# Patient Record
Sex: Female | Born: 1975 | Race: Black or African American | Hispanic: No | Marital: Married | Smoking: Never smoker
Health system: Southern US, Community
[De-identification: ages and names within clinical notes are randomized; demographics above are authoritative.]

## PROBLEM LIST (undated history)

## (undated) DIAGNOSIS — G4730 Sleep apnea, unspecified: Secondary | ICD-10-CM

## (undated) DIAGNOSIS — I10 Essential (primary) hypertension: Secondary | ICD-10-CM

## (undated) DIAGNOSIS — Z8489 Family history of other specified conditions: Secondary | ICD-10-CM

## (undated) DIAGNOSIS — M545 Low back pain, unspecified: Secondary | ICD-10-CM

## (undated) DIAGNOSIS — D259 Leiomyoma of uterus, unspecified: Secondary | ICD-10-CM

## (undated) DIAGNOSIS — Z973 Presence of spectacles and contact lenses: Secondary | ICD-10-CM

---

## 1994-12-18 HISTORY — PX: DIAGNOSTIC LAPAROSCOPY: SUR761

## 1998-12-18 HISTORY — PX: TUBAL LIGATION: SHX77

## 2014-03-13 ENCOUNTER — Ambulatory Visit (HOSPITAL_BASED_OUTPATIENT_CLINIC_OR_DEPARTMENT_OTHER): Payer: BC Managed Care – PPO

## 2014-04-24 ENCOUNTER — Ambulatory Visit (HOSPITAL_BASED_OUTPATIENT_CLINIC_OR_DEPARTMENT_OTHER): Payer: BC Managed Care – PPO | Attending: Family Medicine

## 2015-03-01 ENCOUNTER — Other Ambulatory Visit: Payer: Self-pay | Admitting: Family Medicine

## 2015-03-01 DIAGNOSIS — Z1231 Encounter for screening mammogram for malignant neoplasm of breast: Secondary | ICD-10-CM

## 2015-03-12 ENCOUNTER — Ambulatory Visit
Admission: RE | Admit: 2015-03-12 | Discharge: 2015-03-12 | Disposition: A | Discharge status: Home / Self Care | Payer: BLUE CROSS/BLUE SHIELD | Source: Ambulatory Visit | Attending: Family Medicine | Admitting: Family Medicine

## 2015-03-12 ENCOUNTER — Other Ambulatory Visit: Payer: Self-pay | Admitting: Family Medicine

## 2015-03-12 DIAGNOSIS — Z1231 Encounter for screening mammogram for malignant neoplasm of breast: Secondary | ICD-10-CM

## 2017-12-25 ENCOUNTER — Other Ambulatory Visit: Payer: Self-pay | Admitting: Family Medicine

## 2017-12-25 DIAGNOSIS — Z1231 Encounter for screening mammogram for malignant neoplasm of breast: Secondary | ICD-10-CM

## 2018-01-14 ENCOUNTER — Ambulatory Visit
Admission: RE | Admit: 2018-01-14 | Discharge: 2018-01-14 | Disposition: A | Discharge status: Home / Self Care | Payer: BLUE CROSS/BLUE SHIELD | Source: Ambulatory Visit | Attending: Family Medicine | Admitting: Family Medicine

## 2018-01-14 DIAGNOSIS — Z1231 Encounter for screening mammogram for malignant neoplasm of breast: Secondary | ICD-10-CM

## 2019-07-12 ENCOUNTER — Encounter (HOSPITAL_COMMUNITY): Payer: Self-pay

## 2019-07-12 ENCOUNTER — Emergency Department (HOSPITAL_COMMUNITY)
Admission: EM | Admit: 2019-07-12 | Discharge: 2019-07-13 | Disposition: A | Discharge status: Home / Self Care | Payer: BC Managed Care – PPO | Attending: Emergency Medicine | Admitting: Emergency Medicine

## 2019-07-12 ENCOUNTER — Other Ambulatory Visit: Payer: Self-pay

## 2019-07-12 DIAGNOSIS — Z5321 Procedure and treatment not carried out due to patient leaving prior to being seen by health care provider: Secondary | ICD-10-CM | POA: Insufficient documentation

## 2019-07-12 DIAGNOSIS — M79641 Pain in right hand: Secondary | ICD-10-CM | POA: Insufficient documentation

## 2019-07-12 NOTE — ED Triage Notes (Signed)
Pt arrives POV for eval of dog bite to R hand from dog that she owns. Pt reports dog is up to date on vaccines. Minor cut to R hand hemostatic in triage

## 2019-07-13 ENCOUNTER — Emergency Department (HOSPITAL_COMMUNITY)
Admission: EM | Admit: 2019-07-13 | Discharge: 2019-07-13 | Disposition: A | Discharge status: Home / Self Care | Payer: Self-pay | Attending: Emergency Medicine | Admitting: Emergency Medicine

## 2019-07-13 DIAGNOSIS — S61256A Open bite of right little finger without damage to nail, initial encounter: Secondary | ICD-10-CM | POA: Insufficient documentation

## 2019-07-13 DIAGNOSIS — W540XXA Bitten by dog, initial encounter: Secondary | ICD-10-CM | POA: Insufficient documentation

## 2019-07-13 DIAGNOSIS — Y929 Unspecified place or not applicable: Secondary | ICD-10-CM | POA: Insufficient documentation

## 2019-07-13 DIAGNOSIS — Y999 Unspecified external cause status: Secondary | ICD-10-CM | POA: Insufficient documentation

## 2019-07-13 DIAGNOSIS — Y93K9 Activity, other involving animal care: Secondary | ICD-10-CM | POA: Insufficient documentation

## 2019-07-13 MED ORDER — AMOXICILLIN-POT CLAVULANATE 875-125 MG PO TABS
1.0000 | ORAL_TABLET | Freq: Once | ORAL | Status: AC
  Administered 2019-07-13: 1 via ORAL
  Filled 2019-07-13: qty 1

## 2019-07-13 MED ORDER — IBUPROFEN 800 MG PO TABS
800.0000 mg | ORAL_TABLET | Freq: Once | ORAL | Status: AC
  Administered 2019-07-13: 800 mg via ORAL
  Filled 2019-07-13: qty 1

## 2019-07-13 MED ORDER — AMOXICILLIN-POT CLAVULANATE 875-125 MG PO TABS: 1.0000 | ORAL_TABLET | Freq: Two times a day (BID) | ORAL | 0 refills | Status: DC

## 2019-07-13 NOTE — ED Notes (Signed)
Pt. Came to desk about 20 minutes ago asking how long. Time estimate was given and patient went to have a seat. This tech just received a call from [employer] stating pt. Had checked in there. Pt. Needs to be discharged from our system.

## 2019-07-13 NOTE — Discharge Instructions (Addendum)
Keep finger clean with soap and warm water. Can take tylenol or motrin for pain. Take augmentin as directed, recommend taking with food. Follow-up with your primary care doctor. Return here for any new/acute changes.

## 2019-07-13 NOTE — ED Triage Notes (Signed)
Pt here for evaluation of a dog bite to her right pinky finger

## 2019-07-13 NOTE — ED Provider Notes (Signed)
[facility] DEPT Provider Note   CSN: [id number] Arrival date & time: 07/13/19  0105     History   Chief Complaint No chief complaint on file.   HPI Deanna Mack is a 43 y.o. female.  43 y.o. F here with dog bite to right 5th digit from her dog (mix german Shepherd/lab).  States dog is generally not aggressive but it "snipped" at her.  Has 1.5cm laceration/skin tear to right 5th digit.  No active bleeding.  Tetanus UTD.  Dog is UTD on vaccinations.  The history is provided by the patient and medical records.    No past medical history on file.  There are no active problems to display for this patient.   No past surgical history on file.   OB History   No obstetric history on file.      Home Medications    Prior to Admission medications   Not on File    Family History No family history on file.  Social History Social History   Tobacco Use  . Smoking status: Never Smoker  . Smokeless tobacco: Never Used  Substance Use Topics  . Alcohol use: Not Currently  . Drug use: Not Currently     Allergies   Patient has no known allergies.   Review of Systems Review of Systems  Skin: Positive for wound.  All other systems reviewed and are negative.    Physical Exam Updated Vital Signs BP (!) 183/92 (BP Location: Left Arm)   Temp 99 F (37.2 C) (Oral)   Resp 16   Ht 5\' 8"  (1.727 m)   Wt 99.8 kg   SpO2 98%   BMI 33.45 kg/m   Physical Exam Vitals signs and nursing note reviewed.  Constitutional:      Appearance: She is well-developed.  HENT:     Head: Normocephalic and atraumatic.  Eyes:     Conjunctiva/sclera: Conjunctivae normal.     Pupils: Pupils are equal, round, and reactive to light.  Neck:     Musculoskeletal: Normal range of motion.  Cardiovascular:     Rate and Rhythm: Normal rate and regular rhythm.     Heart sounds: Normal heart sounds.  Pulmonary:     Effort: Pulmonary effort is normal.      Breath sounds: Normal breath sounds.  Abdominal:     General: Bowel sounds are normal.     Palpations: Abdomen is soft.  Musculoskeletal: Normal range of motion.     Comments: Right 5th digit with 1.5cm superficial laceration (resembles more of a skin tear); no active bleeding, full ROM of finger, no deep tissue, vessel, or tendon involvement  Skin:    General: Skin is warm and dry.  Neurological:     Mental Status: She is alert and oriented to person, place, and time.      ED Treatments / Results  Labs (all labs ordered are listed, but only abnormal results are displayed) Labs Reviewed - No data to display  EKG None  Radiology No results found.  Procedures Procedures (including critical care time)  Medications Ordered in ED Medications  amoxicillin-clavulanate (AUGMENTIN) 875-125 MG per tablet 1 tablet (has no administration in time range)  ibuprofen (ADVIL) tablet 800 mg (has no administration in time range)     Initial Impression / Assessment and Plan / ED Course  I have reviewed the triage vital signs and the nursing notes.  Pertinent labs & imaging results that were available during my care  of the patient were reviewed by me and considered in my medical decision making (see chart for details).  43 y.o. F here with superficial laceration (more of a skin tear) to right 5th digit from her dog.  Her tetanus is up-to-date and dog is up-to-date on vaccines.  There is no active bleeding of the wound and does not require repair.  Will start on course of Augmentin, continue home wound care.  Close follow-up with PCP.  Return here for any new or acute changes.  Final Clinical Impressions(s) / ED Diagnoses   Final diagnoses:  Dog bite, initial encounter    ED Discharge Orders         Ordered    amoxicillin-clavulanate (AUGMENTIN) 875-125 MG tablet  Every 12 hours     07/13/19 0130           Larene Pickett, PA-C 07/13/19 Sobieski, [place], DO 07/13/19  8675

## 2019-09-18 DIAGNOSIS — U071 COVID-19: Secondary | ICD-10-CM

## 2019-09-18 HISTORY — DX: COVID-19: U07.1

## 2019-12-17 DIAGNOSIS — R05 Cough: Secondary | ICD-10-CM | POA: Diagnosis not present

## 2019-12-17 DIAGNOSIS — R0981 Nasal congestion: Secondary | ICD-10-CM | POA: Diagnosis not present

## 2019-12-17 DIAGNOSIS — Z0000 Encounter for general adult medical examination without abnormal findings: Secondary | ICD-10-CM | POA: Diagnosis not present

## 2019-12-29 ENCOUNTER — Other Ambulatory Visit: Payer: Self-pay | Admitting: Internal Medicine

## 2019-12-29 DIAGNOSIS — Z0000 Encounter for general adult medical examination without abnormal findings: Secondary | ICD-10-CM | POA: Diagnosis not present

## 2019-12-29 DIAGNOSIS — Z13220 Encounter for screening for lipoid disorders: Secondary | ICD-10-CM | POA: Diagnosis not present

## 2019-12-29 DIAGNOSIS — Z1231 Encounter for screening mammogram for malignant neoplasm of breast: Secondary | ICD-10-CM

## 2019-12-29 DIAGNOSIS — D649 Anemia, unspecified: Secondary | ICD-10-CM | POA: Diagnosis not present

## 2019-12-29 DIAGNOSIS — Z23 Encounter for immunization: Secondary | ICD-10-CM | POA: Diagnosis not present

## 2019-12-31 ENCOUNTER — Other Ambulatory Visit: Payer: Self-pay

## 2019-12-31 ENCOUNTER — Ambulatory Visit
Admission: RE | Admit: 2019-12-31 | Discharge: 2019-12-31 | Disposition: A | Discharge status: Home / Self Care | Payer: BC Managed Care – PPO | Source: Ambulatory Visit | Attending: Internal Medicine | Admitting: Internal Medicine

## 2019-12-31 DIAGNOSIS — Z1231 Encounter for screening mammogram for malignant neoplasm of breast: Secondary | ICD-10-CM | POA: Diagnosis not present

## 2020-10-19 DIAGNOSIS — G479 Sleep disorder, unspecified: Secondary | ICD-10-CM | POA: Diagnosis not present

## 2020-10-29 ENCOUNTER — Other Ambulatory Visit (HOSPITAL_BASED_OUTPATIENT_CLINIC_OR_DEPARTMENT_OTHER): Payer: Self-pay

## 2020-10-29 DIAGNOSIS — R0683 Snoring: Secondary | ICD-10-CM

## 2020-10-29 DIAGNOSIS — G4710 Hypersomnia, unspecified: Secondary | ICD-10-CM

## 2020-10-29 DIAGNOSIS — R0681 Apnea, not elsewhere classified: Secondary | ICD-10-CM

## 2020-11-22 ENCOUNTER — Ambulatory Visit (HOSPITAL_BASED_OUTPATIENT_CLINIC_OR_DEPARTMENT_OTHER): Payer: 59 | Attending: Internal Medicine | Admitting: Internal Medicine

## 2020-11-22 ENCOUNTER — Other Ambulatory Visit: Payer: Self-pay

## 2020-11-22 DIAGNOSIS — G4710 Hypersomnia, unspecified: Secondary | ICD-10-CM | POA: Diagnosis not present

## 2020-11-22 DIAGNOSIS — R0681 Apnea, not elsewhere classified: Secondary | ICD-10-CM | POA: Diagnosis not present

## 2020-11-22 DIAGNOSIS — G4733 Obstructive sleep apnea (adult) (pediatric): Secondary | ICD-10-CM

## 2020-11-22 DIAGNOSIS — R0683 Snoring: Secondary | ICD-10-CM | POA: Diagnosis not present

## 2020-12-06 DIAGNOSIS — G4733 Obstructive sleep apnea (adult) (pediatric): Secondary | ICD-10-CM | POA: Diagnosis not present

## 2020-12-06 NOTE — Procedures (Signed)
    NAME: Deanna Mack DATE OF BIRTH:  1976/06/11 MEDICAL RECORD NUMBER [id number]  LOCATION: [facility]  PHYSICIAN: Marius Ditch  DATE OF STUDY: 11/22/2020  SLEEP STUDY TYPE: Out of Center Sleep Test                REFERRING PHYSICIAN: Marius Ditch, MD  CLINICAL INFORMATION The patient was referred to the sleep center for evaluation of nonrestorative sleep, loud snoring, excessive daytime sleepiness, witnessed apnea.   MEDICATIONS Medications administered during study: none  SLEEP STUDY TECHNIQUE A multi-channel overnight portable sleep study was performed. The channels recorded were: nasal and oral airflow, thoracic and abdominal respiratory movement, and oxygen saturation with a pulse oximetry. Snoring and body position were also monitored.  TECHNICIAN COMMENTS Comments added by Technician: N/A Comments added by Scorer: N/A  RECORDING SUMMARY The study was initiated at 11:33:59 PM and terminated at 6:01:11 AM. The total recorded time was 387.2 minutes. Time in bed was 386.7 minutes.  RESPIRATORY PARAMETERS The overall AHI was 12.7 per hour, with a central apnea index of 0.0 per hour. The patient was supine for 42.5%. The oxygen nadir was 87% during sleep. CARDIAC DATA Mean heart rate during sleep was 69.0 bpm.  IMPRESSIONS - Mild Obstructive Sleep apnea(OSA) - Minimal oxygen desaturations.  DIAGNOSIS - Obstructive Sleep Apnea (G47.33)  RECOMMENDATIONS - OV with referring physician or sleep physician to determine treatment plan.    Marius Ditch Sleep specialist, [place] [employer]  ELECTRONICALLY SIGNED ON:  12/06/2020, 8:21 PM [place] PH: [phone]   FX: [phone] [place]

## 2020-12-14 DIAGNOSIS — G4733 Obstructive sleep apnea (adult) (pediatric): Secondary | ICD-10-CM | POA: Diagnosis not present

## 2021-01-14 DIAGNOSIS — E663 Overweight: Secondary | ICD-10-CM | POA: Diagnosis not present

## 2021-01-14 DIAGNOSIS — Z13220 Encounter for screening for lipoid disorders: Secondary | ICD-10-CM | POA: Diagnosis not present

## 2021-01-14 DIAGNOSIS — R300 Dysuria: Secondary | ICD-10-CM | POA: Diagnosis not present

## 2021-01-14 DIAGNOSIS — G4730 Sleep apnea, unspecified: Secondary | ICD-10-CM | POA: Diagnosis not present

## 2021-01-14 DIAGNOSIS — Z0000 Encounter for general adult medical examination without abnormal findings: Secondary | ICD-10-CM | POA: Diagnosis not present

## 2021-01-14 DIAGNOSIS — M545 Low back pain, unspecified: Secondary | ICD-10-CM | POA: Diagnosis not present

## 2021-01-14 DIAGNOSIS — R030 Elevated blood-pressure reading, without diagnosis of hypertension: Secondary | ICD-10-CM | POA: Diagnosis not present

## 2021-01-31 DIAGNOSIS — I10 Essential (primary) hypertension: Secondary | ICD-10-CM | POA: Diagnosis not present

## 2021-04-04 ENCOUNTER — Other Ambulatory Visit (HOSPITAL_COMMUNITY): Payer: Self-pay

## 2021-04-04 MED ORDER — SPIRONOLACTONE 50 MG PO TABS
50.0000 mg | ORAL_TABLET | Freq: Every day | ORAL | 1 refills | Status: DC
  Filled 2021-04-04 – 2021-04-06 (×2): qty 90, 90d supply, fill #0

## 2021-04-04 MED ORDER — HYDROCHLOROTHIAZIDE 12.5 MG PO CAPS
12.5000 mg | ORAL_CAPSULE | Freq: Every day | ORAL | 2 refills | Status: DC
  Filled 2021-04-04 – 2021-04-15 (×2): qty 90, 90d supply, fill #0

## 2021-04-06 ENCOUNTER — Other Ambulatory Visit (HOSPITAL_COMMUNITY): Payer: Self-pay

## 2021-04-15 ENCOUNTER — Other Ambulatory Visit (HOSPITAL_COMMUNITY): Payer: Self-pay

## 2021-06-15 DIAGNOSIS — G4733 Obstructive sleep apnea (adult) (pediatric): Secondary | ICD-10-CM | POA: Diagnosis not present

## 2021-07-13 ENCOUNTER — Other Ambulatory Visit (HOSPITAL_COMMUNITY): Payer: Self-pay

## 2021-07-14 ENCOUNTER — Other Ambulatory Visit (HOSPITAL_COMMUNITY): Payer: Self-pay

## 2021-07-15 ENCOUNTER — Other Ambulatory Visit (HOSPITAL_COMMUNITY): Payer: Self-pay

## 2021-07-15 DIAGNOSIS — G4733 Obstructive sleep apnea (adult) (pediatric): Secondary | ICD-10-CM | POA: Diagnosis not present

## 2021-07-15 MED ORDER — SPIRONOLACTONE 50 MG PO TABS
50.0000 mg | ORAL_TABLET | Freq: Every day | ORAL | 1 refills | Status: DC
  Filled 2021-07-15: qty 90, 90d supply, fill #0
  Filled 2021-10-18: qty 90, 90d supply, fill #1

## 2021-07-20 ENCOUNTER — Other Ambulatory Visit: Payer: Self-pay | Admitting: Internal Medicine

## 2021-07-20 DIAGNOSIS — Z1231 Encounter for screening mammogram for malignant neoplasm of breast: Secondary | ICD-10-CM

## 2021-07-28 ENCOUNTER — Other Ambulatory Visit: Payer: Self-pay

## 2021-07-28 ENCOUNTER — Ambulatory Visit
Admission: RE | Admit: 2021-07-28 | Discharge: 2021-07-28 | Disposition: A | Discharge status: Home / Self Care | Payer: 59 | Source: Ambulatory Visit

## 2021-07-28 DIAGNOSIS — Z1231 Encounter for screening mammogram for malignant neoplasm of breast: Secondary | ICD-10-CM | POA: Diagnosis not present

## 2021-08-15 DIAGNOSIS — G4733 Obstructive sleep apnea (adult) (pediatric): Secondary | ICD-10-CM | POA: Diagnosis not present

## 2021-08-19 ENCOUNTER — Other Ambulatory Visit (HOSPITAL_COMMUNITY): Payer: Self-pay

## 2021-08-19 MED ORDER — PEG 3350-KCL-NA BICARB-NACL 420 G PO SOLR
ORAL | 0 refills | Status: DC
  Filled 2021-08-19: qty 4000, 1d supply, fill #0

## 2021-08-19 MED ORDER — DULCOLAX 5 MG PO TBEC: DELAYED_RELEASE_TABLET | ORAL | 0 refills | Status: DC

## 2021-08-23 DIAGNOSIS — Z20822 Contact with and (suspected) exposure to covid-19: Secondary | ICD-10-CM | POA: Diagnosis not present

## 2021-08-25 DIAGNOSIS — K5730 Diverticulosis of large intestine without perforation or abscess without bleeding: Secondary | ICD-10-CM | POA: Diagnosis not present

## 2021-08-25 DIAGNOSIS — Z1211 Encounter for screening for malignant neoplasm of colon: Secondary | ICD-10-CM | POA: Diagnosis not present

## 2021-08-25 DIAGNOSIS — K635 Polyp of colon: Secondary | ICD-10-CM | POA: Diagnosis not present

## 2021-09-12 DIAGNOSIS — G4733 Obstructive sleep apnea (adult) (pediatric): Secondary | ICD-10-CM | POA: Diagnosis not present

## 2021-09-15 DIAGNOSIS — G4733 Obstructive sleep apnea (adult) (pediatric): Secondary | ICD-10-CM | POA: Diagnosis not present

## 2021-10-03 ENCOUNTER — Other Ambulatory Visit (HOSPITAL_COMMUNITY): Payer: Self-pay

## 2021-10-03 DIAGNOSIS — Z23 Encounter for immunization: Secondary | ICD-10-CM | POA: Diagnosis not present

## 2021-10-03 DIAGNOSIS — M25512 Pain in left shoulder: Secondary | ICD-10-CM | POA: Diagnosis not present

## 2021-10-05 ENCOUNTER — Ambulatory Visit
Admission: RE | Admit: 2021-10-05 | Discharge: 2021-10-05 | Disposition: A | Discharge status: Home / Self Care | Payer: 59 | Source: Ambulatory Visit | Attending: Internal Medicine | Admitting: Internal Medicine

## 2021-10-05 ENCOUNTER — Other Ambulatory Visit (HOSPITAL_COMMUNITY): Payer: Self-pay

## 2021-10-05 ENCOUNTER — Other Ambulatory Visit: Payer: Self-pay | Admitting: Internal Medicine

## 2021-10-05 DIAGNOSIS — M25512 Pain in left shoulder: Secondary | ICD-10-CM

## 2021-10-05 DIAGNOSIS — M19012 Primary osteoarthritis, left shoulder: Secondary | ICD-10-CM | POA: Diagnosis not present

## 2021-10-05 MED ORDER — MELOXICAM 15 MG PO TABS
ORAL_TABLET | ORAL | 0 refills | Status: DC
  Filled 2021-10-05: qty 30, 30d supply, fill #0

## 2021-10-12 DIAGNOSIS — G4733 Obstructive sleep apnea (adult) (pediatric): Secondary | ICD-10-CM | POA: Diagnosis not present

## 2021-10-14 ENCOUNTER — Other Ambulatory Visit (HOSPITAL_COMMUNITY): Payer: Self-pay

## 2021-10-14 DIAGNOSIS — M25512 Pain in left shoulder: Secondary | ICD-10-CM | POA: Diagnosis not present

## 2021-10-14 DIAGNOSIS — M7542 Impingement syndrome of left shoulder: Secondary | ICD-10-CM | POA: Diagnosis not present

## 2021-10-14 MED ORDER — PREDNISONE 5 MG (21) PO TBPK
ORAL_TABLET | ORAL | 1 refills | Status: DC
  Filled 2021-10-14: qty 21, 6d supply, fill #0

## 2021-10-18 ENCOUNTER — Other Ambulatory Visit (HOSPITAL_COMMUNITY): Payer: Self-pay

## 2021-11-07 DIAGNOSIS — H35033 Hypertensive retinopathy, bilateral: Secondary | ICD-10-CM | POA: Diagnosis not present

## 2021-11-14 DIAGNOSIS — N926 Irregular menstruation, unspecified: Secondary | ICD-10-CM | POA: Diagnosis not present

## 2021-11-14 DIAGNOSIS — Z8619 Personal history of other infectious and parasitic diseases: Secondary | ICD-10-CM | POA: Diagnosis not present

## 2021-11-14 DIAGNOSIS — R590 Localized enlarged lymph nodes: Secondary | ICD-10-CM | POA: Diagnosis not present

## 2021-11-14 DIAGNOSIS — Z01419 Encounter for gynecological examination (general) (routine) without abnormal findings: Secondary | ICD-10-CM | POA: Diagnosis not present

## 2022-01-04 DIAGNOSIS — G4733 Obstructive sleep apnea (adult) (pediatric): Secondary | ICD-10-CM | POA: Diagnosis not present

## 2022-01-19 ENCOUNTER — Other Ambulatory Visit (HOSPITAL_COMMUNITY): Payer: Self-pay

## 2022-01-19 DIAGNOSIS — L700 Acne vulgaris: Secondary | ICD-10-CM | POA: Diagnosis not present

## 2022-01-19 DIAGNOSIS — L819 Disorder of pigmentation, unspecified: Secondary | ICD-10-CM | POA: Diagnosis not present

## 2022-01-19 MED ORDER — HYDROQUINONE 4 % EX CREA
TOPICAL_CREAM | CUTANEOUS | 2 refills | Status: DC
  Filled 2022-01-19: qty 56.7, 30d supply, fill #0
  Filled 2022-05-04: qty 28.4, 30d supply, fill #1

## 2022-01-19 MED ORDER — SPIRONOLACTONE 50 MG PO TABS
50.0000 mg | ORAL_TABLET | Freq: Every day | ORAL | 2 refills | Status: DC
  Filled 2022-01-19: qty 90, 90d supply, fill #0
  Filled 2022-05-04: qty 50, 50d supply, fill #1
  Filled 2022-05-04: qty 40, 40d supply, fill #1
  Filled 2022-08-10: qty 90, 90d supply, fill #2

## 2022-01-20 ENCOUNTER — Other Ambulatory Visit (HOSPITAL_COMMUNITY): Payer: Self-pay

## 2022-01-26 DIAGNOSIS — Z131 Encounter for screening for diabetes mellitus: Secondary | ICD-10-CM | POA: Diagnosis not present

## 2022-01-26 DIAGNOSIS — Z0000 Encounter for general adult medical examination without abnormal findings: Secondary | ICD-10-CM | POA: Diagnosis not present

## 2022-01-26 DIAGNOSIS — I10 Essential (primary) hypertension: Secondary | ICD-10-CM | POA: Diagnosis not present

## 2022-01-26 DIAGNOSIS — Z13220 Encounter for screening for lipoid disorders: Secondary | ICD-10-CM | POA: Diagnosis not present

## 2022-01-26 DIAGNOSIS — G4730 Sleep apnea, unspecified: Secondary | ICD-10-CM | POA: Diagnosis not present

## 2022-04-03 DIAGNOSIS — G4733 Obstructive sleep apnea (adult) (pediatric): Secondary | ICD-10-CM | POA: Diagnosis not present

## 2022-05-04 ENCOUNTER — Other Ambulatory Visit (HOSPITAL_COMMUNITY): Payer: Self-pay

## 2022-05-04 DIAGNOSIS — G4733 Obstructive sleep apnea (adult) (pediatric): Secondary | ICD-10-CM | POA: Diagnosis not present

## 2022-05-05 ENCOUNTER — Other Ambulatory Visit (HOSPITAL_COMMUNITY): Payer: Self-pay

## 2022-08-10 ENCOUNTER — Other Ambulatory Visit (HOSPITAL_COMMUNITY): Payer: Self-pay

## 2022-08-17 DIAGNOSIS — G4733 Obstructive sleep apnea (adult) (pediatric): Secondary | ICD-10-CM | POA: Diagnosis not present

## 2022-09-04 DIAGNOSIS — N923 Ovulation bleeding: Secondary | ICD-10-CM | POA: Diagnosis not present

## 2022-09-04 DIAGNOSIS — N946 Dysmenorrhea, unspecified: Secondary | ICD-10-CM | POA: Diagnosis not present

## 2022-09-04 DIAGNOSIS — N939 Abnormal uterine and vaginal bleeding, unspecified: Secondary | ICD-10-CM | POA: Diagnosis not present

## 2022-09-07 IMAGING — CR DG SHOULDER 2+V*L*
4 series · 4 of 4 positions shown · non-contrast
Comparison: None.

CLINICAL DATA: Pain and decreased range of motion in the left
shoulder, no known injury, initial encounter

EXAM:
LEFT SHOULDER - 2+ VIEW

[w shoulder ap internal left]
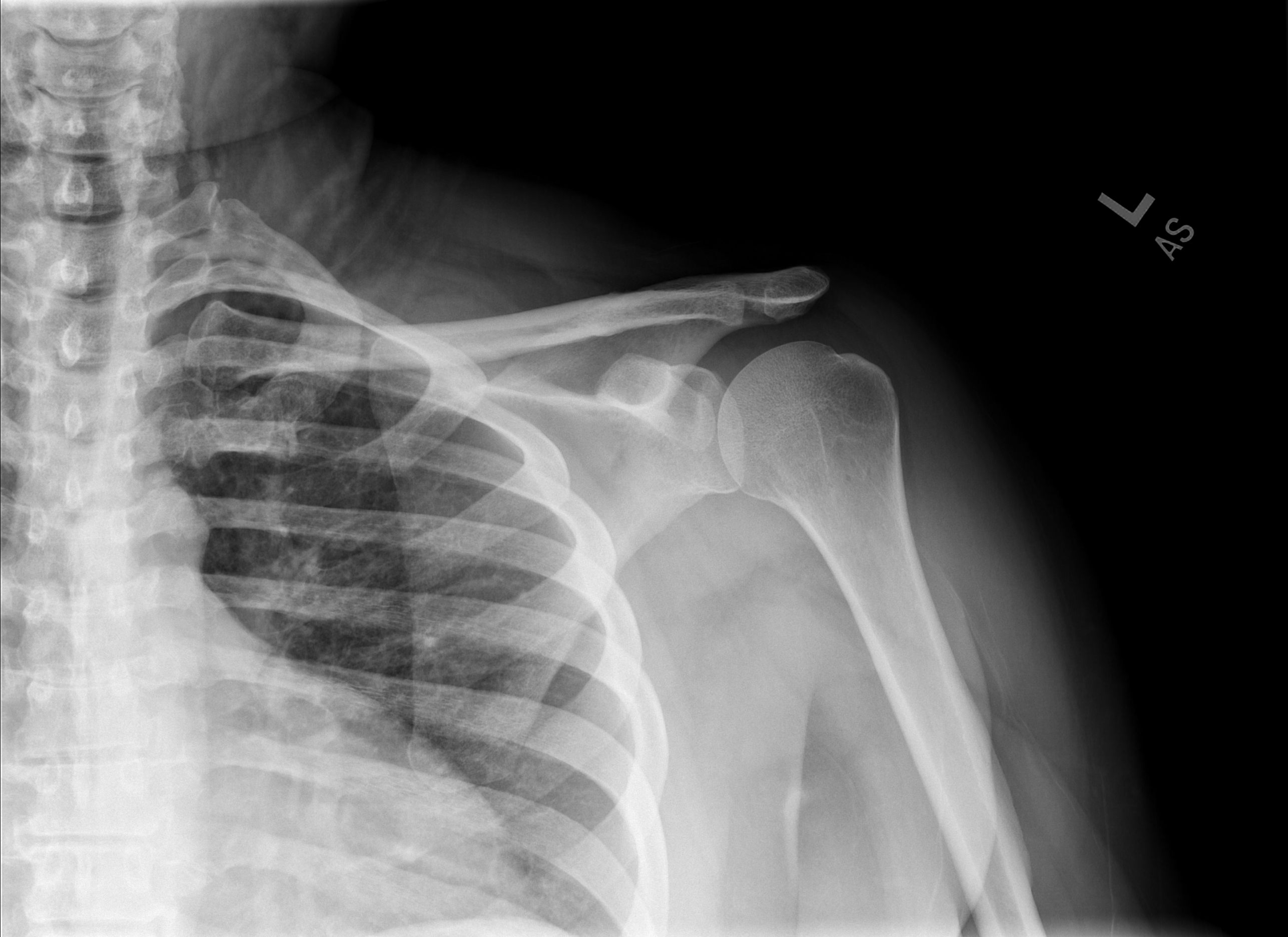

[w shoulder y view left *]
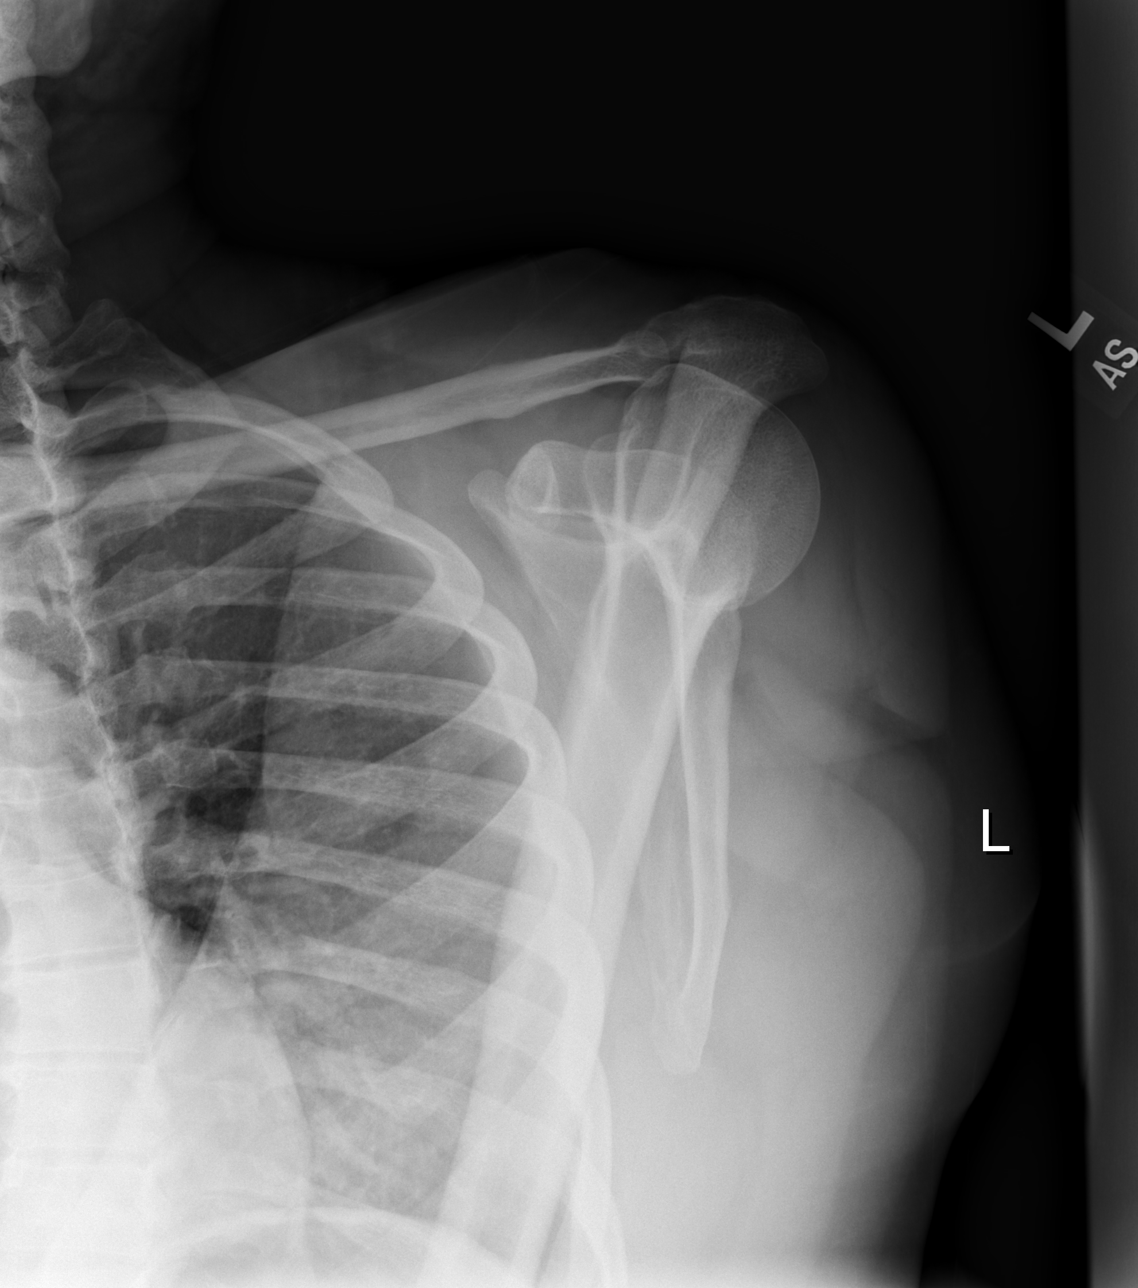

[w shoulder axillary left *]
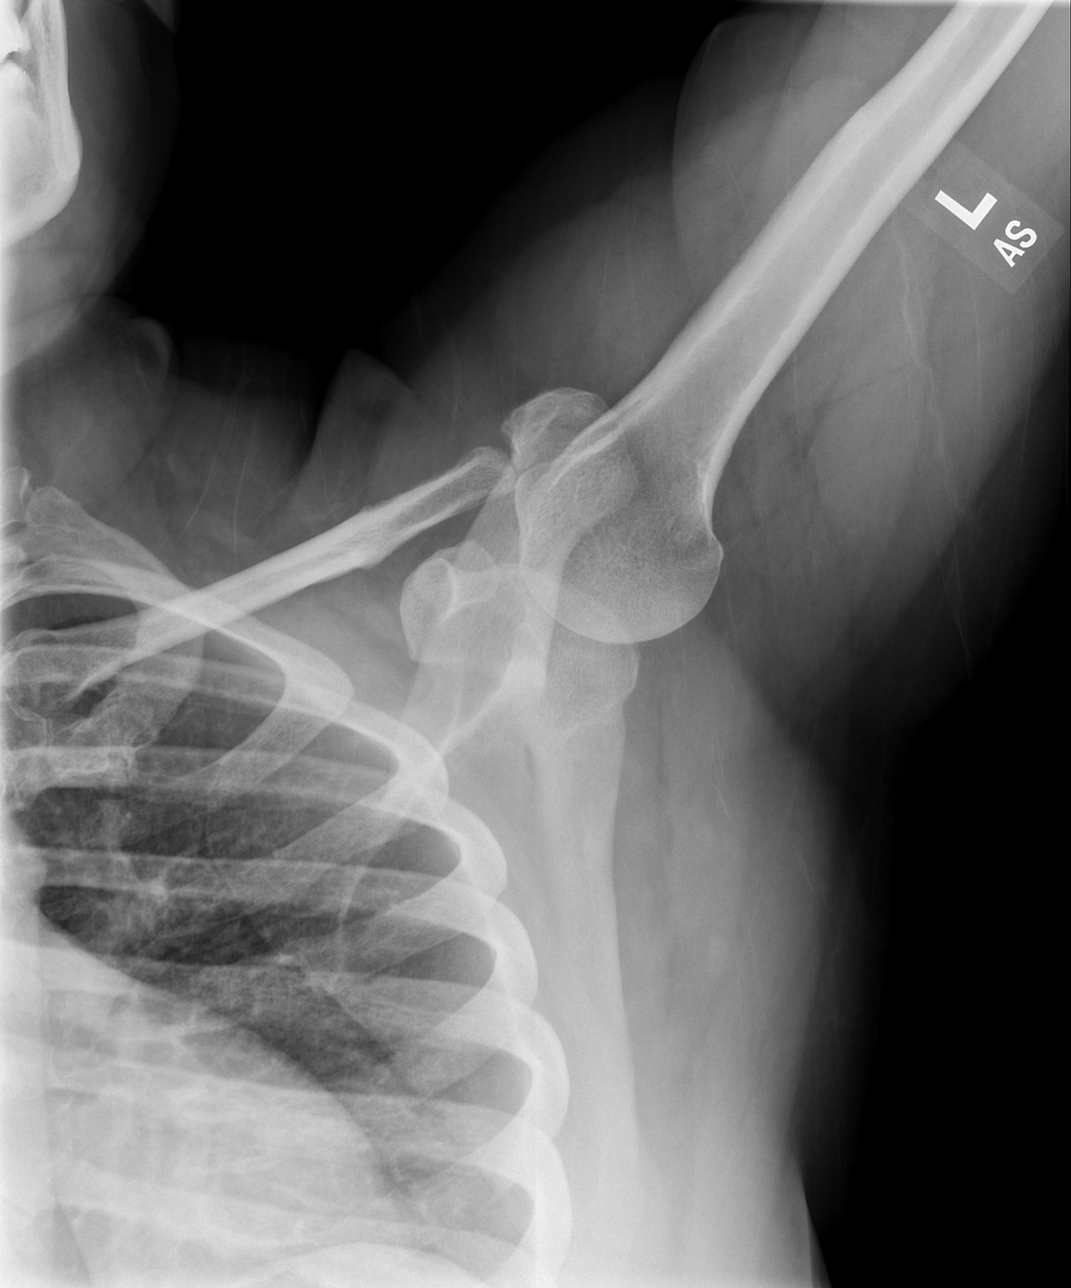

[w shoulder y view left]
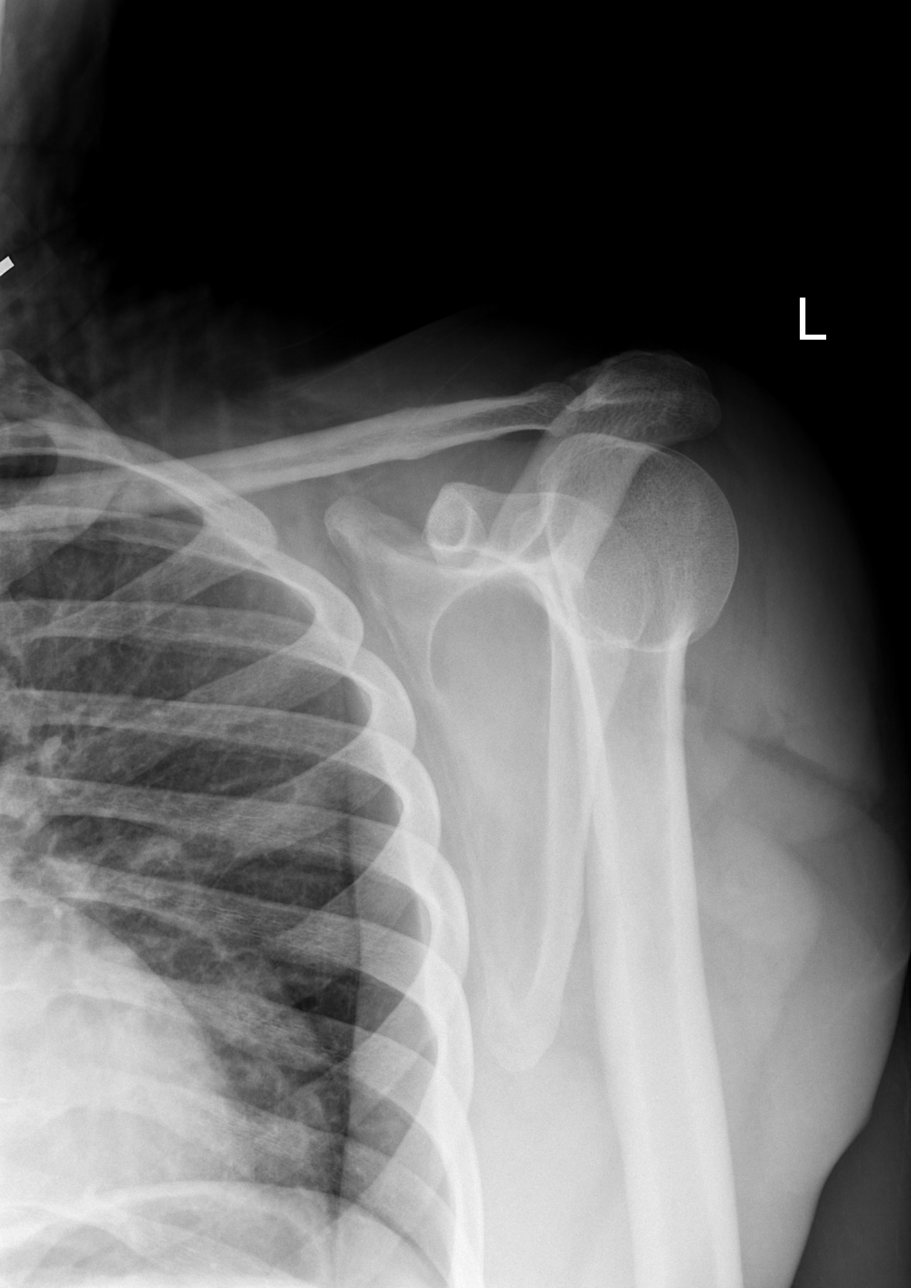

[4 of 4 positions shown; findings below may reference images not displayed]

FINDINGS: No acute fracture or dislocation is noted. Very mild degenerative
changes of the left acromioclavicular joint are seen. Underlying
bony thorax is within normal limits.
IMPRESSION: Mild degenerative change without acute abnormality.

## 2022-09-08 ENCOUNTER — Other Ambulatory Visit (HOSPITAL_COMMUNITY): Payer: Self-pay

## 2022-09-08 DIAGNOSIS — Z03818 Encounter for observation for suspected exposure to other biological agents ruled out: Secondary | ICD-10-CM | POA: Diagnosis not present

## 2022-09-08 DIAGNOSIS — R059 Cough, unspecified: Secondary | ICD-10-CM | POA: Diagnosis not present

## 2022-09-08 MED ORDER — AZITHROMYCIN 250 MG PO TABS
ORAL_TABLET | ORAL | 0 refills | Status: DC
  Filled 2022-09-08: qty 6, 5d supply, fill #0

## 2022-09-12 DIAGNOSIS — D259 Leiomyoma of uterus, unspecified: Secondary | ICD-10-CM | POA: Diagnosis not present

## 2022-09-12 DIAGNOSIS — N939 Abnormal uterine and vaginal bleeding, unspecified: Secondary | ICD-10-CM | POA: Diagnosis not present

## 2022-09-12 DIAGNOSIS — Z3202 Encounter for pregnancy test, result negative: Secondary | ICD-10-CM | POA: Diagnosis not present

## 2022-09-15 ENCOUNTER — Other Ambulatory Visit (HOSPITAL_COMMUNITY): Payer: Self-pay

## 2022-09-15 MED ORDER — NORETHINDRONE ACETATE 5 MG PO TABS
5.0000 mg | ORAL_TABLET | Freq: Every day | ORAL | 1 refills | Status: DC
  Filled 2022-09-15: qty 90, 90d supply, fill #0

## 2022-10-05 DIAGNOSIS — F419 Anxiety disorder, unspecified: Secondary | ICD-10-CM | POA: Diagnosis not present

## 2022-10-05 DIAGNOSIS — N926 Irregular menstruation, unspecified: Secondary | ICD-10-CM | POA: Diagnosis not present

## 2022-11-23 ENCOUNTER — Other Ambulatory Visit (HOSPITAL_COMMUNITY): Payer: Self-pay

## 2022-11-23 MED ORDER — NORETHINDRONE ACETATE 5 MG PO TABS
10.0000 mg | ORAL_TABLET | Freq: Every day | ORAL | 0 refills | Status: DC
  Filled 2022-11-23 – 2022-11-28 (×3): qty 60, 30d supply, fill #0

## 2022-11-24 ENCOUNTER — Other Ambulatory Visit (HOSPITAL_COMMUNITY): Payer: Self-pay

## 2022-11-24 MED ORDER — SPIRONOLACTONE 50 MG PO TABS
50.0000 mg | ORAL_TABLET | Freq: Every day | ORAL | 2 refills | Status: DC
  Filled 2022-11-24: qty 90, 90d supply, fill #0
  Filled 2023-02-27: qty 90, 90d supply, fill #1
  Filled 2023-06-20: qty 90, 90d supply, fill #2

## 2022-11-25 ENCOUNTER — Other Ambulatory Visit (HOSPITAL_COMMUNITY): Payer: Self-pay

## 2022-11-27 ENCOUNTER — Other Ambulatory Visit (HOSPITAL_COMMUNITY): Payer: Self-pay

## 2022-11-28 ENCOUNTER — Other Ambulatory Visit (HOSPITAL_COMMUNITY): Payer: Self-pay

## 2022-12-08 ENCOUNTER — Other Ambulatory Visit (HOSPITAL_COMMUNITY): Payer: Self-pay

## 2022-12-22 ENCOUNTER — Other Ambulatory Visit (HOSPITAL_COMMUNITY): Payer: Self-pay

## 2022-12-22 DIAGNOSIS — D259 Leiomyoma of uterus, unspecified: Secondary | ICD-10-CM | POA: Diagnosis not present

## 2022-12-22 DIAGNOSIS — N939 Abnormal uterine and vaginal bleeding, unspecified: Secondary | ICD-10-CM | POA: Diagnosis not present

## 2022-12-22 MED ORDER — MEDROXYPROGESTERONE ACETATE 5 MG PO TABS
5.0000 mg | ORAL_TABLET | Freq: Every day | ORAL | 0 refills | Status: DC
  Filled 2022-12-22: qty 90, 90d supply, fill #0

## 2022-12-25 ENCOUNTER — Other Ambulatory Visit (HOSPITAL_COMMUNITY): Payer: Self-pay

## 2022-12-26 ENCOUNTER — Other Ambulatory Visit (HOSPITAL_COMMUNITY): Payer: Self-pay

## 2023-01-30 DIAGNOSIS — G4733 Obstructive sleep apnea (adult) (pediatric): Secondary | ICD-10-CM | POA: Diagnosis not present

## 2023-02-15 DIAGNOSIS — D259 Leiomyoma of uterus, unspecified: Secondary | ICD-10-CM | POA: Diagnosis not present

## 2023-02-15 DIAGNOSIS — N939 Abnormal uterine and vaginal bleeding, unspecified: Secondary | ICD-10-CM | POA: Diagnosis not present

## 2023-02-27 ENCOUNTER — Other Ambulatory Visit (HOSPITAL_COMMUNITY): Payer: Self-pay

## 2023-02-28 DIAGNOSIS — G4733 Obstructive sleep apnea (adult) (pediatric): Secondary | ICD-10-CM | POA: Diagnosis not present

## 2023-03-09 ENCOUNTER — Other Ambulatory Visit (HOSPITAL_COMMUNITY): Payer: Self-pay

## 2023-03-09 MED ORDER — CYCLOBENZAPRINE HCL 10 MG PO TABS
10.0000 mg | ORAL_TABLET | Freq: Three times a day (TID) | ORAL | 0 refills | Status: AC | PRN
  Filled 2023-03-09: qty 30, 10d supply, fill #0

## 2023-03-12 ENCOUNTER — Emergency Department (HOSPITAL_COMMUNITY): Payer: Commercial Managed Care - PPO

## 2023-03-12 ENCOUNTER — Encounter (HOSPITAL_COMMUNITY): Payer: Self-pay

## 2023-03-12 ENCOUNTER — Other Ambulatory Visit: Payer: Self-pay

## 2023-03-12 ENCOUNTER — Emergency Department (HOSPITAL_COMMUNITY)
Admission: EM | Admit: 2023-03-12 | Discharge: 2023-03-13 | Disposition: A | Discharge status: Home / Self Care | Payer: Commercial Managed Care - PPO | Attending: Emergency Medicine | Admitting: Emergency Medicine

## 2023-03-12 DIAGNOSIS — R109 Unspecified abdominal pain: Secondary | ICD-10-CM | POA: Diagnosis not present

## 2023-03-12 DIAGNOSIS — R110 Nausea: Secondary | ICD-10-CM | POA: Insufficient documentation

## 2023-03-12 DIAGNOSIS — M545 Low back pain, unspecified: Secondary | ICD-10-CM

## 2023-03-12 DIAGNOSIS — M5440 Lumbago with sciatica, unspecified side: Secondary | ICD-10-CM | POA: Insufficient documentation

## 2023-03-12 LAB — COMPREHENSIVE METABOLIC PANEL
ALT: 16 U/L (ref 0–44)
AST: 21 U/L (ref 15–41)
Albumin: 4.3 g/dL (ref 3.5–5.0)
Alkaline Phosphatase: 48 U/L (ref 38–126)
Anion gap: 13 (ref 5–15)
BUN: 7 mg/dL (ref 6–20)
CO2: 23 mmol/L (ref 22–32)
Calcium: 9.9 mg/dL (ref 8.9–10.3)
Chloride: 100 mmol/L (ref 98–111)
Creatinine, Ser: 1.1 mg/dL — ABNORMAL HIGH (ref 0.44–1.00)
GFR, Estimated: >60 mL/min (ref >60)
Glucose, Bld: 89 mg/dL (ref 70–99)
Potassium: 3.9 mmol/L (ref 3.5–5.1)
Sodium: 136 mmol/L (ref 135–145)
Total Bilirubin: 0.6 mg/dL (ref 0.3–1.2)
Total Protein: 7.5 g/dL (ref 6.5–8.1)

## 2023-03-12 LAB — CBC WITH DIFFERENTIAL/PLATELET
Abs Immature Granulocytes: 0.02 10*3/uL (ref 0.00–0.07)
Basophils Absolute: 0 10*3/uL (ref 0.0–0.1)
Basophils Relative: 0 %
Eosinophils Absolute: 0.1 10*3/uL (ref 0.0–0.5)
Eosinophils Relative: 1 %
HCT: 43.7 % (ref 36.0–46.0)
Hemoglobin: 14.9 g/dL (ref 12.0–15.0)
Immature Granulocytes: 0 %
Lymphocytes Relative: 24 %
Lymphs Abs: 2.6 10*3/uL (ref 0.7–4.0)
MCH: 30.7 pg (ref 26.0–34.0)
MCHC: 34.1 g/dL (ref 30.0–36.0)
MCV: 89.9 fL (ref 80.0–100.0)
Monocytes Absolute: 0.6 10*3/uL (ref 0.1–1.0)
Monocytes Relative: 6 %
Neutro Abs: 7.4 10*3/uL (ref 1.7–7.7)
Neutrophils Relative %: 69 %
Platelets: 285 10*3/uL (ref 150–400)
RBC: 4.86 MIL/uL (ref 3.87–5.11)
RDW: 13.1 % (ref 11.5–15.5)
WBC: 10.8 10*3/uL — ABNORMAL HIGH (ref 4.0–10.5)
nRBC: 0 % (ref 0.0–0.2)

## 2023-03-12 LAB — URINALYSIS, ROUTINE W REFLEX MICROSCOPIC
Bilirubin Urine: NEGATIVE
Glucose, UA: NEGATIVE mg/dL
Ketones, ur: NEGATIVE mg/dL
Leukocytes,Ua: NEGATIVE
Nitrite: NEGATIVE
Protein, ur: NEGATIVE mg/dL
RBC / HPF: >50 RBC/hpf (ref 0–5)
Specific Gravity, Urine: 1.009 (ref 1.005–1.030)
pH: 5 (ref 5.0–8.0)

## 2023-03-12 LAB — I-STAT BETA HCG BLOOD, ED (MC, WL, AP ONLY): I-stat hCG, quantitative: <5 m[IU]/mL (ref <5)

## 2023-03-12 LAB — LIPASE, BLOOD: Lipase: 39 U/L (ref 11–51)

## 2023-03-12 MED ORDER — LIDOCAINE 5 % EX PTCH
1.0000 | MEDICATED_PATCH | CUTANEOUS | Status: DC
  Administered 2023-03-12: 1 via TRANSDERMAL
  Filled 2023-03-12: qty 1

## 2023-03-12 MED ORDER — ONDANSETRON HCL 4 MG/2ML IJ SOLN
4.0000 mg | Freq: Once | INTRAMUSCULAR | Status: AC
  Administered 2023-03-12: 4 mg via INTRAVENOUS
  Filled 2023-03-12: qty 2

## 2023-03-12 MED ORDER — OXYCODONE-ACETAMINOPHEN 5-325 MG PO TABS
1.0000 | ORAL_TABLET | Freq: Four times a day (QID) | ORAL | 0 refills | Status: DC | PRN
  Filled 2023-03-12: qty 6, 2d supply, fill #0

## 2023-03-12 MED ORDER — LIDOCAINE 5 % EX PTCH: 1.0000 | MEDICATED_PATCH | CUTANEOUS | 0 refills | Status: DC

## 2023-03-12 MED ORDER — MORPHINE SULFATE (PF) 4 MG/ML IV SOLN
4.0000 mg | Freq: Once | INTRAVENOUS | Status: AC
  Administered 2023-03-12: 4 mg via INTRAVENOUS
  Filled 2023-03-12: qty 1

## 2023-03-12 MED ORDER — LIDOCAINE 5 % EX PTCH
1.0000 | MEDICATED_PATCH | CUTANEOUS | 0 refills | Status: DC
  Filled 2023-03-13: qty 30, 30d supply, fill #0

## 2023-03-12 MED ORDER — HYDROCODONE-ACETAMINOPHEN 5-325 MG PO TABS
1.0000 | ORAL_TABLET | Freq: Once | ORAL | Status: AC
  Administered 2023-03-12: 1 via ORAL
  Filled 2023-03-12: qty 1

## 2023-03-12 NOTE — Discharge Instructions (Signed)
Evaluation for your lower back pain was overall reassuring.  Suspect it is chronic.  Recommend you follow-up with your PCP. In the meantime you can take Percocet for acute pain and apply Lidoderm patches. You can also apply ice to areas of pain to reduce swelling and inflammation. If you have new urinary incontinence, bowel incontinence, saddle anesthesia, or any other concern please return to the ED for further evaluation.

## 2023-03-12 NOTE — ED Triage Notes (Signed)
Pt has had hx of "back spasms" and had a flare of this on Friday.  Was seen and given flexeril.  Presents today as it is hard for her walk and get in and out of bed d/t pain.  Was improving but today was doing household chores and "felt a catch" and has had severe pain since.

## 2023-03-12 NOTE — ED Provider Triage Note (Addendum)
Emergency Medicine Provider Triage Evaluation Note  Deanna Mack , a 47 y.o. female  was evaluated in triage.  Pt complains of lower back pain onset 4 days.  Was given a prescription for Flexeril.  Notes her pain was improving with Flexeril and Tylenol however after putting a load of dishes in the dishwasher she was walking upstairs and felt a catch to her back.  Denies hitting her head or LOC.  Denies bowel/bladder incontinence, saddle paresthesia, numbness, tingling.  Review of Systems  Positive:  Negative:   Physical Exam  BP 137/83   Pulse 95   Temp 98.6 F (37 C) (Oral)   Resp 18   SpO2 100%  Gen:   Awake, no distress   Resp:  Normal effort  MSK:   Moves extremities without difficulty  Other:  No spinal TTP noted. Left lumbar TTP noted to musculature of lumbar region.  Strength and sensation intact to bilateral lower extremities.  Medical Decision Making  Medically screening exam initiated at 3:16 PM.  Appropriate orders placed.  Azana C Mack was informed that the remainder of the evaluation will be completed by another provider, this initial triage assessment does not replace that evaluation, and the importance of remaining in the ED until their evaluation is complete.  Work-up initiated.    Anglea Gordner A, PA-C 03/12/23 1527    Wenona Mayville A, PA-C 03/12/23 1527

## 2023-03-12 NOTE — ED Provider Notes (Signed)
[facility] Provider Note   CSN: [id number] Arrival date & time: 03/12/23  1509     History  Chief Complaint  Patient presents with   Back Pain   HPI Deanna Mack is a 47 y.o. female with uterine fibroids presenting for back pain.  Started Friday.  Primarily located in the lower back left lower back is worse than right lower back.  Denies saddle anesthesia, urinary or bowel incontinence, Lower extremity numbness or weakness, fever and recent back surgery.  States she has had this kind of back pain before usually occurs with her menstrual cycle.  Patient is on her period at this time.  States that the pain is much worse this time.  Denies urinary changes.  States she has been nauseous but denies vomiting and diarrhea.   Back Pain      Home Medications Prior to Admission medications   Medication Sig Start Date End Date Taking? Authorizing Provider  oxyCODONE-acetaminophen (PERCOCET/ROXICET) 5-325 MG tablet Take 1 tablet by mouth every 6 (six) hours as needed for severe pain. 03/12/23  Yes Harriet Pho, PA-C  amoxicillin-clavulanate (AUGMENTIN) 875-125 MG tablet Take 1 tablet by mouth every 12 (twelve) hours. 07/13/19   Larene Pickett, PA-C  azithromycin (ZITHROMAX) 250 MG tablet Take 2 tablets by mouth now, then 1 tablet daily for 4 days. 09/08/22     bisacodyl (DULCOLAX) 5 MG EC tablet Take 4 tablets by mouth as a one time dose as directed. 08/19/21     cyclobenzaprine (FLEXERIL) 10 MG tablet Take 1 tablet (10 mg) by mouth every 8 hours as needed for muscle spasms 03/09/23     hydrochlorothiazide (MICROZIDE) 12.5 MG capsule Take 1 capsule (12.5 mg total) by mouth every morning. 01/14/21     hydroquinone 4 % cream Apply to face at night as tolerated.  Use up to 3 months as directed. 01/19/22     lidocaine (LIDODERM) 5 % Place 1 patch onto the skin daily. Remove & Discard patch within 12 hours or as directed by MD 03/12/23   Harriet Pho, PA-C  medroxyPROGESTERone (PROVERA) 5 MG tablet Take 1 tablet (5 mg total) by mouth at bedtime. 12/22/22     meloxicam (MOBIC) 15 MG tablet Take 1 tablet by mouth once daily. 10/03/21   Seward Carol, MD  polyethylene glycol-electrolytes (GAVILYTE-N WITH FLAVOR PACK) 420 g solution Mix and drink by mouth as directed. 08/19/21     predniSONE (STERAPRED UNI-PAK 21 TAB) 5 MG (21) TBPK tablet Take as directed on package 10/14/21     spironolactone (ALDACTONE) 50 MG tablet Take 1 tablet by mouth daily. 07/15/21     spironolactone (ALDACTONE) 50 MG tablet Take 1 tablet (50 mg total) by mouth daily. 11/24/22     norethindrone (AYGESTIN) 5 MG tablet Take 2 tablets (10 mg total) by mouth daily. 11/23/22 12/22/22        Allergies    Patient has no known allergies.    Review of Systems   Review of Systems  Musculoskeletal:  Positive for back pain.    Physical Exam   Vitals:   03/12/23 1830 03/12/23 2205  BP: 138/80 127/74  Pulse: 84 74  Resp: 19 16  Temp: 98.3 F (36.8 C) 98.6 F (37 C)  SpO2: 100% 99%    CONSTITUTIONAL: well-appearing, NAD NEURO:  Alert and oriented x 3, CN 3-12 grossly intact EYES:  eyes equal and reactive ENT/NECK:  Supple, no stridor  CARDIO:  regular rate and rhythm, appears well-perfused  PULM:  No respiratory distress, CTAB GI/GU:  non-distended, soft, left CVA tenderness MSK/SPINE: Gait appears normal.  Generalized lower back tenderness.  No midline tenderness.  Range of motion of the hips appears normal. SKIN:  no rash, atraumatic   *Additional and/or pertinent findings included in MDM below    ED Results / Procedures / Treatments   Labs (all labs ordered are listed, but only abnormal results are displayed) Labs Reviewed  CBC WITH DIFFERENTIAL/PLATELET - Abnormal; Notable for the following components:      Result Value   WBC 10.8 (*)    All other components within normal limits  COMPREHENSIVE METABOLIC PANEL - Abnormal; Notable for the following  components:   Creatinine, Ser 1.10 (*)    All other components within normal limits  URINALYSIS, ROUTINE W REFLEX MICROSCOPIC - Abnormal; Notable for the following components:   APPearance HAZY (*)    Hgb urine dipstick LARGE (*)    Bacteria, UA FEW (*)    All other components within normal limits  LIPASE, BLOOD  I-STAT BETA HCG BLOOD, ED ([facility], [facility], [facility] ONLY)    EKG None  Radiology CT Renal Stone Study  Result Date: 03/12/2023 CLINICAL DATA:  Abdominal/flank pain, stone suspected EXAM: CT ABDOMEN AND PELVIS WITHOUT CONTRAST TECHNIQUE: Multidetector CT imaging of the abdomen and pelvis was performed following the standard protocol without IV contrast. RADIATION DOSE REDUCTION: This exam was performed according to the departmental dose-optimization program which includes automated exposure control, adjustment of the mA and/or kV according to patient size and/or use of iterative reconstruction technique. COMPARISON:  None Available. FINDINGS: Lower chest: No acute abnormality. Hepatobiliary: No focal liver abnormality. No gallstones, gallbladder wall thickening, or pericholecystic fluid. No biliary dilatation. Pancreas: No focal lesion. Normal pancreatic contour. No surrounding inflammatory changes. No main pancreatic ductal dilatation. Spleen: Normal in size without focal abnormality. Adrenals/Urinary Tract: No adrenal nodule bilaterally. No nephrolithiasis and no hydronephrosis. No definite contour-deforming renal mass. No ureterolithiasis or hydroureter. The urinary bladder is unremarkable. Stomach/Bowel: Stomach is within normal limits. No evidence of bowel wall thickening or dilatation. Appendix appears normal. Vascular/Lymphatic: No abdominal aorta or iliac aneurysm. No abdominal, pelvic, or inguinal lymphadenopathy. Reproductive: Lobulated enlarged uterine contour. Bilateral necks regions are unremarkable. Other: No intraperitoneal free fluid. No intraperitoneal free gas. No organized fluid  collection. Musculoskeletal: No abdominal wall hernia or abnormality. No suspicious lytic or blastic osseous lesions. No acute displaced fracture. IMPRESSION: 1. Lobulated enlarged uterine contour. Likely underlying uterine fibroid. Recommend pelvic ultrasound. 2. Otherwise no acute intra-abdominal or intrapelvic abnormality. Electronically Signed   By: Iven Finn M.D.   On: 03/12/2023 22:48    Procedures Procedures    Medications Ordered in ED Medications  lidocaine (LIDODERM) 5 % 1 patch (1 patch Transdermal Patch Applied 03/12/23 2318)  morphine (PF) 4 MG/ML injection 4 mg (4 mg Intravenous Given 03/12/23 2103)  ondansetron (ZOFRAN) injection 4 mg (4 mg Intravenous Given 03/12/23 2102)  HYDROcodone-acetaminophen (NORCO/VICODIN) 5-325 MG per tablet 1 tablet (1 tablet Oral Given 03/12/23 2319)    ED Course/ Medical Decision Making/ A&P                             Medical Decision Making Amount and/or Complexity of Data Reviewed Labs: ordered. Radiology: ordered.  Risk Prescription drug management.   Initial Impression and Ddx 47 year old female who is well-appearing.  Exam notable for left CVA  tenderness and generalized lower back tenderness.  DDx includes nephrolithiasis, pyelonephritis, lumbar radiculopathy, sciatica Patient PMH that increases complexity of ED encounter:  none  Interpretation of Diagnostics I independent reviewed and interpreted the labs as followed: Hematuria, elevated creatinine, leukocytosis  - I independently visualized the following imaging with scope of interpretation limited to determining acute life threatening conditions related to emergency care: CT renal stone study, which revealed enlarged lobulated uterine contour but no other acute findings  Patient Reassessment and Ultimate Disposition/Management Treated pain with morphine and nausea with Zofran.  Patient stated her pain was improved.  Later applied a lidocaine patch to area of pain and Norco.   Initially suspected renal pathology but unlikely given reassuring CT scan.  Overall back pain is likely chronic.  Sent Norco and lidocaine patches to her pharmacy.  Advised to follow-up with her PCP.  Discussed return precautions.  Patient management required discussion with the following services or consulting groups:  None  Complexity of Problems Addressed Acute complicated illness or Injury  Additional Data Reviewed and Analyzed Further history obtained from: None  Patient Encounter Risk Assessment Prescriptions         Final Clinical Impression(s) / ED Diagnoses Final diagnoses:  Low back pain, unspecified back pain laterality, unspecified chronicity, unspecified whether sciatica present    Rx / DC Orders ED Discharge Orders          Ordered    lidocaine (LIDODERM) 5 %  Every 24 hours,   Status:  Discontinued        03/12/23 2317    lidocaine (LIDODERM) 5 %  Every 24 hours        03/12/23 2334    oxyCODONE-acetaminophen (PERCOCET/ROXICET) 5-325 MG tablet  Every 6 hours PRN        03/12/23 2334              Harriet Pho, PA-C 03/12/23 2338    Lacretia Leigh, MD 03/13/23 1747

## 2023-03-13 ENCOUNTER — Other Ambulatory Visit (HOSPITAL_COMMUNITY): Payer: Self-pay

## 2023-03-21 DIAGNOSIS — M545 Low back pain, unspecified: Secondary | ICD-10-CM | POA: Diagnosis not present

## 2023-03-21 DIAGNOSIS — N920 Excessive and frequent menstruation with regular cycle: Secondary | ICD-10-CM | POA: Diagnosis not present

## 2023-03-22 ENCOUNTER — Other Ambulatory Visit (HOSPITAL_COMMUNITY): Payer: Self-pay

## 2023-03-22 MED ORDER — TRAMADOL HCL 50 MG PO TABS
ORAL_TABLET | ORAL | 0 refills | Status: DC
  Filled 2023-03-22: qty 7, 7d supply, fill #0

## 2023-03-22 MED ORDER — CYCLOBENZAPRINE HCL 10 MG PO TABS
ORAL_TABLET | ORAL | 0 refills | Status: AC
  Filled 2023-03-22: qty 30, 10d supply, fill #0

## 2023-03-31 DIAGNOSIS — G4733 Obstructive sleep apnea (adult) (pediatric): Secondary | ICD-10-CM | POA: Diagnosis not present

## 2023-05-23 ENCOUNTER — Other Ambulatory Visit (HOSPITAL_COMMUNITY): Payer: Self-pay

## 2023-05-23 MED ORDER — CYCLOBENZAPRINE HCL 10 MG PO TABS
10.0000 mg | ORAL_TABLET | Freq: Three times a day (TID) | ORAL | 0 refills | Status: AC | PRN
  Filled 2023-05-23: qty 30, 10d supply, fill #0

## 2023-05-23 MED ORDER — TRAMADOL HCL 50 MG PO TABS
50.0000 mg | ORAL_TABLET | Freq: Every day | ORAL | 0 refills | Status: AC
  Filled 2023-05-23: qty 7, 7d supply, fill #0

## 2023-05-28 ENCOUNTER — Other Ambulatory Visit (HOSPITAL_COMMUNITY): Payer: Self-pay

## 2023-05-29 DIAGNOSIS — G4733 Obstructive sleep apnea (adult) (pediatric): Secondary | ICD-10-CM | POA: Diagnosis not present

## 2023-06-01 ENCOUNTER — Encounter (HOSPITAL_BASED_OUTPATIENT_CLINIC_OR_DEPARTMENT_OTHER): Payer: Self-pay | Admitting: Obstetrics and Gynecology

## 2023-06-04 ENCOUNTER — Other Ambulatory Visit: Payer: Self-pay

## 2023-06-04 ENCOUNTER — Encounter (HOSPITAL_BASED_OUTPATIENT_CLINIC_OR_DEPARTMENT_OTHER): Payer: Self-pay | Admitting: Obstetrics and Gynecology

## 2023-06-04 DIAGNOSIS — N939 Abnormal uterine and vaginal bleeding, unspecified: Secondary | ICD-10-CM | POA: Diagnosis not present

## 2023-06-04 DIAGNOSIS — D219 Benign neoplasm of connective and other soft tissue, unspecified: Secondary | ICD-10-CM | POA: Diagnosis not present

## 2023-06-04 DIAGNOSIS — Z01818 Encounter for other preprocedural examination: Secondary | ICD-10-CM | POA: Diagnosis not present

## 2023-06-04 NOTE — Progress Notes (Signed)
Your procedure is scheduled on Wednesday, 06/27/2023.  Report to [facility] AT  12:15 AM.   Call this number if you have problems the morning of surgery  [phone].   OUR ADDRESS IS [address].  WE ARE LOCATED IN [facility].  PLEASE BRING YOUR INSURANCE CARD AND PHOTO ID DAY OF SURGERY.  ONLY 2 PEOPLE ARE ALLOWED IN  WAITING  ROOM                                      REMEMBER:  DO NOT EAT FOOD, CANDY GUM OR MINTS  AFTER MIDNIGHT THE NIGHT BEFORE YOUR SURGERY . YOU MAY HAVE CLEAR LIQUIDS FROM MIDNIGHT THE NIGHT BEFORE YOUR SURGERY UNTIL  11:15 AM. NO CLEAR LIQUIDS AFTER   11:15 AM DAY OF SURGERY.  YOU MAY  BRUSH YOUR TEETH MORNING OF SURGERY AND RINSE YOUR MOUTH OUT, NO CHEWING GUM CANDY OR MINTS.     CLEAR LIQUID DIET    Allowed      Water                                                                   Coffee and tea, regular and decaf  (NO cream or milk products of any type, may sweeten)                         Carbonated beverages, regular and diet                                    Sports drinks like Gatorade _____________________________________________________________________     TAKE ONLY THESE MEDICATIONS MORNING OF SURGERY: Flexeril if needed                                        DO NOT WEAR JEWERLY/  METAL/  PIERCINGS (INCLUDING NO PLASTIC PIERCINGS) DO NOT WEAR LOTIONS, POWDERS, PERFUMES OR NAIL POLISH ON YOUR FINGERNAILS. TOENAIL POLISH IS OK TO WEAR. DO NOT SHAVE FOR 48 HOURS PRIOR TO DAY OF SURGERY.  CONTACTS, GLASSES, OR DENTURES MAY NOT BE WORN TO SURGERY.  REMEMBER: NO SMOKING, VAPING ,  DRUGS OR ALCOHOL FOR 24 HOURS BEFORE YOUR SURGERY.                                    [facility] IS NOT RESPONSIBLE  FOR ANY BELONGINGS.                                                                    Marland Kitchen           [facility] - Preparing for Surgery  Before surgery, you can play an important role.  Because skin  is not sterile, your skin needs to be as free of germs as possible.  You can reduce the number of germs on your skin by washing with CHG (chlorahexidine gluconate) soap before surgery.  CHG is an antiseptic cleaner which kills germs and bonds with the skin to continue killing germs even after washing. Please DO NOT use if you have an allergy to CHG or antibacterial soaps.  If your skin becomes reddened/irritated stop using the CHG and inform your nurse when you arrive at Short Stay. Do not shave (including legs and underarms) for at least 48 hours prior to the first CHG shower.  You may shave your face/neck. Please follow these instructions carefully:  1.  Shower with CHG Soap the night before surgery and the  morning of Surgery.  2.  If you choose to wash your hair, wash your hair first as usual with your  normal  shampoo.  3.  After you shampoo, rinse your hair and body thoroughly to remove the  shampoo.                                        4.  Use CHG as you would any other liquid soap.  You can apply chg directly  to the skin and wash , chg soap provided, night before and morning of your surgery.  5.  Apply the CHG Soap to your body ONLY FROM THE NECK DOWN.   Do not use on face/ open                           Wound or open sores. Avoid contact with eyes, ears mouth and genitals (private parts).                       Wash face,  Genitals (private parts) with your normal soap.             6.  Wash thoroughly, paying special attention to the area where your surgery  will be performed.  7.  Thoroughly rinse your body with warm water from the neck down.  8.  DO NOT shower/wash with your normal soap after using and rinsing off  the CHG Soap.             9.  Pat yourself dry with a clean towel.            10.  Wear clean pajamas.            11.  Place clean sheets on your bed the night of your first shower and do not  sleep with pets. Day of Surgery : Do not apply any lotions/ powders the morning of  surgery.  Please wear clean clothes to the hospital/surgery center.  IF YOU HAVE ANY SKIN IRRITATION OR PROBLEMS WITH THE SURGICAL SOAP, PLEASE GET A BAR OF GOLD DIAL SOAP AND SHOWER THE NIGHT BEFORE YOUR SURGERY AND THE MORNING OF YOUR SURGERY. PLEASE LET THE NURSE KNOW MORNING OF YOUR SURGERY IF YOU HAD ANY PROBLEMS WITH THE SURGICAL SOAP.   YOUR SURGEON MAY HAVE REQUESTED EXTENDED RECOVERY TIME AFTER YOUR SURGERY. IT COULD BE A  JUST A FEW HOURS  UP TO AN OVERNIGHT STAY.  YOUR SURGEON SHOULD HAVE DISCUSSED THIS WITH YOU PRIOR TO  YOUR SURGERY. IN THE EVENT YOU NEED TO STAY OVERNIGHT PLEASE REFER TO THE FOLLOWING GUIDELINES. YOU MAY HAVE UP TO 4 VISITORS  MAY VISIT IN THE EXTENDED RECOVERY ROOM UNTIL 800 PM ONLY.  ONE  VISITOR AGE 80 AND OVER MAY SPEND THE NIGHT AND MUST BE IN EXTENDED RECOVERY ROOM NO LATER THAN 800 PM . YOUR DISCHARGE TIME AFTER YOU SPEND THE NIGHT IS 900 AM THE MORNING AFTER YOUR SURGERY. YOU MAY PACK A SMALL OVERNIGHT BAG WITH TOILETRIES FOR YOUR OVERNIGHT STAY IF YOU WISH.  REGARDLESS OF IF YOU STAY OVER NIGHT OR ARE DISCHARGED THE SAME DAY YOU WILL BE REQUIRED TO HAVE A RESPONSIBLE ADULT (18 YRS OLD OR OLDER) STAY WITH YOU FOR AT LEAST THE FIRST 24 HOURS  YOUR PRESCRIPTION MEDICATIONS WILL BE PROVIDED DURING YOUR HOSPITAL STAY.  ________________________________________________________________________                                                        QUESTIONS Mechele Claude PRE OP NURSE PHONE [phone].

## 2023-06-04 NOTE — Progress Notes (Signed)
Spoke w/ via phone for pre-op interview---[facility] needs dos----  urine pregnancy per anesthesia, surgeon orders pending as of 06/04/23             Lab results------06/25/23 lab appt for cbc, bmp, type & screen, ekg COVID test -----patient states asymptomatic no test needed Arrive at -------1215 on Wednesday, 06/27/23 NPO after MN NO Solid Food.  Clear liquids from MN until---1115 Med rec completed Medications to take morning of surgery -----Flexeril prn Diabetic medication -----n/a Patient instructed no nail polish to be worn day of surgery Patient instructed to bring photo id and insurance card day of surgery Patient aware to have Driver (ride ) / caregiver    for 24 hours after surgery - wife, Pamara Patient Special Instructions -----Extended / overnight stay instructions given. Pre-Op special Instructions -----Requested orders from Dr. Richardson Dopp on 06/01/23 via Epic IB. Patient verbalized understanding of instructions that were given at this phone interview. Patient denies shortness of breath, chest pain, fever, cough at this phone interview.

## 2023-06-11 DIAGNOSIS — N920 Excessive and frequent menstruation with regular cycle: Secondary | ICD-10-CM | POA: Diagnosis not present

## 2023-06-13 NOTE — Progress Notes (Addendum)
Patient called in and left a message on my voicemail asking when her pre-op lab appt was. I called her back and was unable to leave a message due to her mailbox being full. Will try again later.  Returned patient's call around 9:50 am. I let her know her lab appt is on Monday, 06/25/23 @ 10:30 am.

## 2023-06-20 ENCOUNTER — Other Ambulatory Visit (HOSPITAL_COMMUNITY): Payer: Self-pay

## 2023-06-25 ENCOUNTER — Encounter (HOSPITAL_COMMUNITY)
Admission: RE | Admit: 2023-06-25 | Discharge: 2023-06-25 | Disposition: A | Discharge status: Home / Self Care | Payer: Commercial Managed Care - PPO | Source: Ambulatory Visit | Attending: Obstetrics and Gynecology | Admitting: Obstetrics and Gynecology

## 2023-06-25 ENCOUNTER — Other Ambulatory Visit: Payer: Self-pay | Admitting: Obstetrics and Gynecology

## 2023-06-25 DIAGNOSIS — Z01818 Encounter for other preprocedural examination: Secondary | ICD-10-CM | POA: Insufficient documentation

## 2023-06-25 DIAGNOSIS — N939 Abnormal uterine and vaginal bleeding, unspecified: Secondary | ICD-10-CM

## 2023-06-25 LAB — CBC
HCT: 42.8 % (ref 36.0–46.0)
Hemoglobin: 14.2 g/dL (ref 12.0–15.0)
MCH: 29.9 pg (ref 26.0–34.0)
MCHC: 33.2 g/dL (ref 30.0–36.0)
MCV: 90.1 fL (ref 80.0–100.0)
Platelets: 258 10*3/uL (ref 150–400)
RBC: 4.75 MIL/uL (ref 3.87–5.11)
RDW: 13.2 % (ref 11.5–15.5)
WBC: 9.1 10*3/uL (ref 4.0–10.5)
nRBC: 0 % (ref 0.0–0.2)

## 2023-06-25 LAB — BASIC METABOLIC PANEL
Anion gap: 8 (ref 5–15)
BUN: 10 mg/dL (ref 6–20)
CO2: 25 mmol/L (ref 22–32)
Calcium: 8.9 mg/dL (ref 8.9–10.3)
Chloride: 106 mmol/L (ref 98–111)
Creatinine, Ser: 1.07 mg/dL — ABNORMAL HIGH (ref 0.44–1.00)
GFR, Estimated: >60 mL/min (ref >60)
Glucose, Bld: 96 mg/dL (ref 70–99)
Potassium: 4.1 mmol/L (ref 3.5–5.1)
Sodium: 139 mmol/L (ref 135–145)

## 2023-06-25 LAB — TYPE AND SCREEN
ABO/RH(D): B POS
Antibody Screen: NEGATIVE

## 2023-06-25 NOTE — H&P (Deleted)
  The note originally documented on this encounter has been moved the the encounter in which it belongs.  

## 2023-06-25 NOTE — H&P (Signed)
Reason for Appointment   1.  Preop History of Present Illness    General:  47 y/o presents for preop visit. Pt is schedule for robotic assisted laparoscopic hysterectomy with bilateral salpingectomy on 07/04/2023 for the management of AUB and fibroids.  ---    IN REVIEW:        Patient started NETA 5 mg daily for non-surgical management of AUB-L. She called clinic on 11/17/2022 to report irregular spotting and persistent pelvic cramping. She was advised to increase NETA dose to 10 mg daily at that time.        Spotting has resolved; however, pelvic cramping persists, is intermittent, will occur for 3 days then resolve. She also endorses bloating, fatigue, and breast tenderness unchanged with increased dose. She reports some weight gain.        H/o HTN - CHC contraindicated.        Given enlarged uterus, IUD contraindicated.        ---        She is s/p BTL. Current partner is same sex.          FSH, LH, estradiol, CBC w/o diff, Ferritin, and TSH WNL 09/04/2022.        EMB 09/12/2022 benign.        ---        TVUS 09/12/2022 notable for uterus measuring 18.3 x 10.5 x 11.6 cm. Three uterine fibroids observed -        1) Anterior 7.4 cm        2) Posterior 6.1 cm        3) Midbody 2.7 cm        Endometrium 1.06 cm. Bilateral ovaries normal. No free fluid or adnexal mass seen.  she has tried several different pop's and they did not work for her.  she reports migraine headaches on   she reports changing super plus tampon every 1 hour. on her heaviest day.  she wears a pad as well.  Current Medications Taking Cyclobenzaprine HCl 10 MG Tablet 1 tablet at bedtime as needed Orally q 8 prn Lidocaine 5 % Patch External traMADol HCl 50 MG Tablet 1 tablet as needed Orally Once a day Ferrous Sulfate 325 (65 Fe) MG Tablet 1 tablet Orally Three times a Week Spironolactone 50 MG Tablet 1 tablet Orally Once a day Vitamin D3 1000 UNIT Capsule 1 capsule Orally Once a day Multivitamin . Tablet 1 tablet by  mouth Once daily Iron Medication List reviewed and reconciled with the patient Past Medical History Low back pain chronic seen by pcp 2015, labs neg, had injury picking up backpack 2018, seen chiropractor, now seeing ortho. Covid-05 October 2020. Acne Dr Etta Grandchild. HTN. DUB due to fibroids. Sleep apnea. Surgical History Laparoscopy due to abdominal pain 1994 Tubal Ligation 2001 Colonoscopy 08/2021 Family History Father: alive, diagnosed with Hypertension, Diabetes, Coronary artery disease Mother: alive, A+W Sister 1: alive Sister 2: alive Paternal Grand Father: diagnosed with Diabetes, Hypertension Paternal Grand Mother: diagnosed with Breast cancer Maternal Grand Mother: diagnosed with Diabetes, Hypertension Paternal uncle: diagnosed with Diabetes 5 sister(s) . 1 son(s) , 2 daughter(s) . 4 1/2 sisters- no health problems \\n1  daughter and 1 son has exercise induced asthma No known family Hx of Colon Ca, Colon Polyps or Liver Disease. Social History    General:  Tobacco use  cigarettes:  Never smoked, Tobacco history last updated  06/04/2023, Additional Findings: Tobacco Non-User  Non-smoker for personal reasons, Vaping  No. Alcohol: no. Caffeine: yes, coffee,  occasionally. Recreational drug use: no. Marital Status: wife. Children: 3 children. 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 Tobacco Exposure: no. Gyn History Sexual activity currently sexually active.  Periods : irregular.  LMP 05/25/2023.  Birth control BTL.  Last pap smear date 11/14/2021 NILM, HRHPV neg.  Last mammogram date 07/2021.  H/O Abnormal pap smear yes.  H/O STD TRICH-2006.  Menarche 11.  Other: Colonoscopy on 08/25/2021. Repeat in 10 years.  OB History Number of pregnancies  3 - 1 boy, 2 girls.  Pregnancy # 1  live birth, vaginal delivery, girl.  Pregnancy # 2  live birth, vaginal delivery, girl.  Pregnancy # 3  live birth, vaginal delivery, boy.  Allergies hydroCHLOROthiazide:  swelling Gabapentin: dizziness Hospitalization/Major Diagnostic Procedure No Hospitalization History. Review of Systems    CONSTITUTIONAL:  Chills No.  Fatigue No.  Fever No.  Night sweats No.  Recent travel outside [place] No.  Sweats No.  Weight change No.     OPHTHALMOLOGY:  Blurring of vision no.  Change in vision no.  Double vision no.     ENT:  Dizziness no.  Nose bleeds no.  Sore throat no.  Teeth pain no.     ALLERGY:  Hives no.     CARDIOLOGY:  Chest pain no.  High blood pressure no.  Irregular heart beat no.  Leg edema no.  Palpitations no.     RESPIRATORY:  Shortness of breath no.  Cough no.  Wheezing no.     UROLOGY:  Pain with urination no.  Urinary urgency no.  Urinary frequency no.  Urinary incontinence no.  Difficulty urinating No.  Blood in urine No.     GASTROENTEROLOGY:  Abdominal pain no.  Appetite change no.  Bloating/belching no.  Blood in stool or on toilet paper no.  Change in bowel movements no.  Constipation no.  Diarrhea no.  Difficulty swallowing no.  Nausea no.     FEMALE REPRODUCTIVE:  Vulvar pain no.  Vulvar rash no.  Abnormal vaginal bleeding , heavy menses.  Breast pain no.  Nipple discharge no.  Pain with intercourse no.  Pelvic pain no.  Unusual vaginal discharge no.  Vaginal itching no.     MUSCULOSKELETAL:  Muscle aches no.     NEUROLOGY:  Headache no.  Tingling/numbness no.  Weakness no.     PSYCHOLOGY:  Depression no.  Anxiety no.  Nervousness no.  Sleep disturbances no.  Suicidal ideation no .     ENDOCRINOLOGY:  Excessive thirst no.  Excessive urination no.  Hair loss no.  Heat or cold intolerance no.     HEMATOLOGY/LYMPH:  Abnormal bleeding no.  Easy bruising no.  Swollen glands no.     DERMATOLOGY:  New/changing skin lesion no.  Rash no.  Sores no.  Vital Signs Wt: 246.4, Wt change: 1.4 lbs, Ht: 67, BMI: 38.59, Temp: 98.4, Pulse sitting: 75, BP sitting: 117/81. Examination    General Examination: CONSTITUTIONAL:  alert, oriented, NAD.   SKIN:  moist, warm.  EYES:  Conjunctiva clear.  LUNGS:  good I:E efffort noted,clear to auscultation bilaterally.  HEART:  regular rate and rhythm.  ABDOMEN:  soft, non-tender/non-distended, bowel sounds present.  FEMALE GENITOURINARY:  normal external genitalia, labia - unremarkable, vagina - pink moist mucosa, no lesions or abnormal discharge, cervix - no discharge or lesions or CMT, adnexa - no masses or tenderness, uterus - nontender and 18 week  size on palpation.  EXTREMITIES:  no edema present.  PSYCH:  affect  normal, good eye contact.  Physical Examination    Chaperone present:  Chaperone present  Cleophas Dunker, [place] 06/04/2023 11:36:09 AM >, for pelvic exam.  Assessments 1. Abnormal uterine bleeding (AUB) - N93.9 (Primary) 2. Fibroids - D21.9 Treatment 1. Abnormal uterine bleeding (AUB)        Notes: Planning robotic-assisted laparoscopic hysterectomy with bilateral salpingectomy Pt advised she may stay overnight, or 2 days if conversion to larger incision. Discussed risks of hysterectomy including but not limited to infection, bleeding, conversion to larger incision, damage to her bowel, bladder, or ureters, with the need for further surgery. Discussed risk of blood transfusion and risk of HIV or hep B&C ( with blood transfusion. Pt is aware of risks and desires blood transfusion if needed. Pt advised to avoid NSAIDs (Aspirin, Aleve, Advil, Ibuprofen, Motrin) from now until surgery given risk of bleeding during surgery. She may take Tylenol for pain management. She is advised to avoid eating or drinking starting midnight prior to surgery. Discussed post-surgery avoidance of driving for 1 week and avoidance of lifting weight greater than 10 lbs or intercourse for 6-8 weeks after procedure.   2. Fibroids        Notes: Planning robotic-assisted laparoscopic hysterectomy with bilateral salpingectomy Pt advised she may stay overnight, or 2 days if conversion to larger incision. Discussed  risks of hysterectomy including but not limited to infection, bleeding, conversion to larger incision, damage to her bowel, bladder, or ureters, with the need for further surgery. Discussed risk of blood transfusion and risk of HIV or hep B&C ( with blood transfusion. Pt is aware of risks and desires blood transfusion if needed. Pt advised to avoid NSAIDs (Aspirin, Aleve, Advil, Ibuprofen, Motrin) from now until surgery given risk of bleeding during surgery. She may take Tylenol for pain management. She is advised to avoid eating or drinking starting midnight prior to surgery. Discussed post-surgery avoidance of driving for 1 week and avoidance of lifting weight greater than 10 lbs or intercourse for 6-8 weeks after procedure.

## 2023-06-27 ENCOUNTER — Encounter (HOSPITAL_BASED_OUTPATIENT_CLINIC_OR_DEPARTMENT_OTHER)
Admission: RE | Disposition: A | Discharge status: Home / Self Care | Payer: Self-pay | Source: Home / Self Care | Attending: Obstetrics and Gynecology

## 2023-06-27 ENCOUNTER — Observation Stay (HOSPITAL_COMMUNITY)
Admission: RE | Admit: 2023-06-27 | Discharge: 2023-06-28 | Disposition: A | Discharge status: Home / Self Care | Payer: Commercial Managed Care - PPO | Attending: Obstetrics and Gynecology | Admitting: Obstetrics and Gynecology

## 2023-06-27 ENCOUNTER — Other Ambulatory Visit: Payer: Self-pay

## 2023-06-27 ENCOUNTER — Encounter (HOSPITAL_BASED_OUTPATIENT_CLINIC_OR_DEPARTMENT_OTHER): Payer: Self-pay | Admitting: Obstetrics and Gynecology

## 2023-06-27 ENCOUNTER — Observation Stay (HOSPITAL_BASED_OUTPATIENT_CLINIC_OR_DEPARTMENT_OTHER): Payer: Commercial Managed Care - PPO | Admitting: Anesthesiology

## 2023-06-27 DIAGNOSIS — Z8616 Personal history of COVID-19: Secondary | ICD-10-CM | POA: Diagnosis not present

## 2023-06-27 DIAGNOSIS — Z01818 Encounter for other preprocedural examination: Principal | ICD-10-CM

## 2023-06-27 DIAGNOSIS — Z79899 Other long term (current) drug therapy: Secondary | ICD-10-CM | POA: Insufficient documentation

## 2023-06-27 DIAGNOSIS — N888 Other specified noninflammatory disorders of cervix uteri: Secondary | ICD-10-CM | POA: Diagnosis not present

## 2023-06-27 DIAGNOSIS — D259 Leiomyoma of uterus, unspecified: Secondary | ICD-10-CM | POA: Diagnosis not present

## 2023-06-27 DIAGNOSIS — Z90710 Acquired absence of both cervix and uterus: Secondary | ICD-10-CM | POA: Diagnosis present

## 2023-06-27 DIAGNOSIS — I10 Essential (primary) hypertension: Secondary | ICD-10-CM | POA: Diagnosis not present

## 2023-06-27 DIAGNOSIS — N939 Abnormal uterine and vaginal bleeding, unspecified: Secondary | ICD-10-CM | POA: Diagnosis present

## 2023-06-27 HISTORY — DX: Essential (primary) hypertension: I10

## 2023-06-27 HISTORY — DX: Sleep apnea, unspecified: G47.30

## 2023-06-27 HISTORY — DX: Low back pain, unspecified: M54.50

## 2023-06-27 HISTORY — PX: LAPAROTOMY: SHX154

## 2023-06-27 HISTORY — DX: Leiomyoma of uterus, unspecified: D25.9

## 2023-06-27 HISTORY — PX: ROBOTIC ASSISTED LAPAROSCOPIC HYSTERECTOMY AND SALPINGECTOMY: SHX6379

## 2023-06-27 HISTORY — DX: Family history of other specified conditions: Z84.89

## 2023-06-27 HISTORY — DX: Presence of spectacles and contact lenses: Z97.3

## 2023-06-27 LAB — POCT PREGNANCY, URINE: Preg Test, Ur: NEGATIVE

## 2023-06-27 LAB — ABO/RH: ABO/RH(D): B POS

## 2023-06-27 SURGERY — XI ROBOTIC ASSISTED LAPAROSCOPIC HYSTERECTOMY AND SALPINGECTOMY
Anesthesia: General | Site: Abdomen

## 2023-06-27 MED ORDER — ACETAMINOPHEN 500 MG PO TABS
ORAL_TABLET | ORAL | Status: AC
  Filled 2023-06-27: qty 2

## 2023-06-27 MED ORDER — AMISULPRIDE (ANTIEMETIC) 5 MG/2ML IV SOLN: 10.0000 mg | Freq: Once | INTRAVENOUS | Status: DC | PRN

## 2023-06-27 MED ORDER — ACETAMINOPHEN 500 MG PO TABS
1000.0000 mg | ORAL_TABLET | Freq: Four times a day (QID) | ORAL | Status: DC
  Administered 2023-06-27 – 2023-06-28 (×2): 1000 mg via ORAL

## 2023-06-27 MED ORDER — DEXAMETHASONE SODIUM PHOSPHATE 10 MG/ML IJ SOLN
INTRAMUSCULAR | Status: AC
  Filled 2023-06-27: qty 1

## 2023-06-27 MED ORDER — LACTATED RINGERS IV SOLN: INTRAVENOUS | Status: DC

## 2023-06-27 MED ORDER — LIDOCAINE HCL (PF) 2 % IJ SOLN
INTRAMUSCULAR | Status: AC
  Filled 2023-06-27: qty 5

## 2023-06-27 MED ORDER — HYDROMORPHONE HCL 1 MG/ML IJ SOLN
0.2000 mg | INTRAMUSCULAR | Status: DC | PRN
  Administered 2023-06-27 – 2023-06-28 (×2): 0.6 mg via INTRAVENOUS

## 2023-06-27 MED ORDER — PANTOPRAZOLE SODIUM 40 MG PO TBEC
DELAYED_RELEASE_TABLET | ORAL | Status: AC
  Filled 2023-06-27: qty 1

## 2023-06-27 MED ORDER — OXYCODONE HCL 5 MG PO TABS
ORAL_TABLET | ORAL | Status: AC
  Filled 2023-06-27: qty 2

## 2023-06-27 MED ORDER — DEXAMETHASONE SODIUM PHOSPHATE 10 MG/ML IJ SOLN
INTRAMUSCULAR | Status: DC | PRN
  Administered 2023-06-27: 10 mg via INTRAVENOUS

## 2023-06-27 MED ORDER — FENTANYL CITRATE (PF) 100 MCG/2ML IJ SOLN
INTRAMUSCULAR | Status: AC
  Filled 2023-06-27: qty 2

## 2023-06-27 MED ORDER — SENNA 8.6 MG PO TABS
ORAL_TABLET | ORAL | Status: AC
  Filled 2023-06-27: qty 1

## 2023-06-27 MED ORDER — MIDAZOLAM HCL 2 MG/2ML IJ SOLN
INTRAMUSCULAR | Status: DC | PRN
  Administered 2023-06-27: 2 mg via INTRAVENOUS

## 2023-06-27 MED ORDER — KETOROLAC TROMETHAMINE 30 MG/ML IJ SOLN
INTRAMUSCULAR | Status: AC
  Filled 2023-06-27: qty 1

## 2023-06-27 MED ORDER — OXYCODONE HCL 5 MG PO TABS
5.0000 mg | ORAL_TABLET | ORAL | Status: DC | PRN
  Administered 2023-06-27 – 2023-06-28 (×3): 10 mg via ORAL

## 2023-06-27 MED ORDER — KETOROLAC TROMETHAMINE 30 MG/ML IJ SOLN: 30.0000 mg | Freq: Once | INTRAMUSCULAR | Status: AC

## 2023-06-27 MED ORDER — IBUPROFEN 200 MG PO TABS
ORAL_TABLET | ORAL | Status: AC
  Filled 2023-06-27: qty 3

## 2023-06-27 MED ORDER — MENTHOL 3 MG MT LOZG: 1.0000 | LOZENGE | OROMUCOSAL | Status: DC | PRN

## 2023-06-27 MED ORDER — PANTOPRAZOLE SODIUM 40 MG PO TBEC
40.0000 mg | DELAYED_RELEASE_TABLET | Freq: Every day | ORAL | Status: DC
  Administered 2023-06-27: 40 mg via ORAL

## 2023-06-27 MED ORDER — POVIDONE-IODINE 10 % EX SWAB: 2.0000 | Freq: Once | CUTANEOUS | Status: DC

## 2023-06-27 MED ORDER — ONDANSETRON HCL 4 MG/2ML IJ SOLN
INTRAMUSCULAR | Status: DC | PRN
  Administered 2023-06-27: 4 mg via INTRAVENOUS

## 2023-06-27 MED ORDER — LIDOCAINE 2% (20 MG/ML) 5 ML SYRINGE
INTRAMUSCULAR | Status: DC | PRN
  Administered 2023-06-27: 60 mg via INTRAVENOUS

## 2023-06-27 MED ORDER — ONDANSETRON HCL 4 MG/2ML IJ SOLN
INTRAMUSCULAR | Status: AC
  Filled 2023-06-27: qty 2

## 2023-06-27 MED ORDER — ROCURONIUM BROMIDE 10 MG/ML (PF) SYRINGE
PREFILLED_SYRINGE | INTRAVENOUS | Status: AC
  Filled 2023-06-27: qty 10

## 2023-06-27 MED ORDER — ONDANSETRON HCL 4 MG PO TABS: 4.0000 mg | ORAL_TABLET | Freq: Four times a day (QID) | ORAL | Status: DC | PRN

## 2023-06-27 MED ORDER — IBUPROFEN 600 MG PO TABS
600.0000 mg | ORAL_TABLET | Freq: Four times a day (QID) | ORAL | 1 refills | Status: AC | PRN
  Filled 2023-06-27: qty 30, 8d supply, fill #0

## 2023-06-27 MED ORDER — SENNA 8.6 MG PO TABS
1.0000 | ORAL_TABLET | Freq: Two times a day (BID) | ORAL | Status: DC
  Administered 2023-06-27: 8.6 mg via ORAL

## 2023-06-27 MED ORDER — KETOROLAC TROMETHAMINE 30 MG/ML IJ SOLN
INTRAMUSCULAR | Status: DC | PRN
  Administered 2023-06-27: 30 mg via INTRAVENOUS

## 2023-06-27 MED ORDER — ONDANSETRON HCL 4 MG/2ML IJ SOLN: 4.0000 mg | Freq: Four times a day (QID) | INTRAMUSCULAR | Status: DC | PRN

## 2023-06-27 MED ORDER — SODIUM CHLORIDE (PF) 0.9 % IJ SOLN
INTRAMUSCULAR | Status: DC | PRN
  Administered 2023-06-27: 50 mL
  Administered 2023-06-27: 10 mL

## 2023-06-27 MED ORDER — SPIRONOLACTONE 25 MG PO TABS
50.0000 mg | ORAL_TABLET | Freq: Every day | ORAL | Status: DC
  Administered 2023-06-27: 50 mg via ORAL
  Filled 2023-06-27: qty 2

## 2023-06-27 MED ORDER — SODIUM CHLORIDE 0.9 % IR SOLN
Status: DC | PRN
  Administered 2023-06-27: 1000 mL

## 2023-06-27 MED ORDER — FENTANYL CITRATE (PF) 100 MCG/2ML IJ SOLN
25.0000 ug | INTRAMUSCULAR | Status: DC | PRN
  Administered 2023-06-27: 25 ug via INTRAVENOUS

## 2023-06-27 MED ORDER — ACETAMINOPHEN 500 MG PO TABS
1000.0000 mg | ORAL_TABLET | Freq: Three times a day (TID) | ORAL | 0 refills | Status: AC | PRN
  Filled 2023-06-27: qty 30, 5d supply, fill #0

## 2023-06-27 MED ORDER — FENTANYL CITRATE (PF) 100 MCG/2ML IJ SOLN
INTRAMUSCULAR | Status: DC | PRN
  Administered 2023-06-27: 100 ug via INTRAVENOUS
  Administered 2023-06-27 (×2): 50 ug via INTRAVENOUS

## 2023-06-27 MED ORDER — STERILE WATER FOR IRRIGATION IR SOLN
Status: DC | PRN
  Administered 2023-06-27: 500 mL

## 2023-06-27 MED ORDER — SODIUM CHLORIDE 0.9 % IV SOLN
INTRAVENOUS | Status: AC
  Filled 2023-06-27: qty 2

## 2023-06-27 MED ORDER — ROCURONIUM BROMIDE 10 MG/ML (PF) SYRINGE
PREFILLED_SYRINGE | INTRAVENOUS | Status: DC | PRN
  Administered 2023-06-27: 70 mg via INTRAVENOUS

## 2023-06-27 MED ORDER — MIDAZOLAM HCL 2 MG/2ML IJ SOLN
INTRAMUSCULAR | Status: AC
  Filled 2023-06-27: qty 2

## 2023-06-27 MED ORDER — SCOPOLAMINE 1 MG/3DAYS TD PT72
1.0000 | MEDICATED_PATCH | TRANSDERMAL | Status: DC
  Administered 2023-06-27: 1.5 mg via TRANSDERMAL

## 2023-06-27 MED ORDER — OXYCODONE HCL 5 MG PO TABS
5.0000 mg | ORAL_TABLET | ORAL | 0 refills | Status: AC | PRN
  Filled 2023-06-27: qty 20, 4d supply, fill #0

## 2023-06-27 MED ORDER — SIMETHICONE 80 MG PO CHEW: 80.0000 mg | CHEWABLE_TABLET | Freq: Four times a day (QID) | ORAL | Status: DC | PRN

## 2023-06-27 MED ORDER — SUGAMMADEX SODIUM 200 MG/2ML IV SOLN
INTRAVENOUS | Status: DC | PRN
  Administered 2023-06-27: 250 mg via INTRAVENOUS

## 2023-06-27 MED ORDER — IBUPROFEN 200 MG PO TABS
600.0000 mg | ORAL_TABLET | Freq: Four times a day (QID) | ORAL | Status: DC
  Administered 2023-06-28 (×2): 600 mg via ORAL

## 2023-06-27 MED ORDER — ALUM & MAG HYDROXIDE-SIMETH 200-200-20 MG/5ML PO SUSP: 30.0000 mL | ORAL | Status: DC | PRN

## 2023-06-27 MED ORDER — DEXMEDETOMIDINE HCL IN NACL 80 MCG/20ML IV SOLN
INTRAVENOUS | Status: DC | PRN
  Administered 2023-06-27 (×2): 8 ug via INTRAVENOUS

## 2023-06-27 MED ORDER — ACETAMINOPHEN 500 MG PO TABS
1000.0000 mg | ORAL_TABLET | ORAL | Status: AC
  Administered 2023-06-27: 1000 mg via ORAL

## 2023-06-27 MED ORDER — HEMOSTATIC AGENTS (NO CHARGE) OPTIME
TOPICAL | Status: DC | PRN
  Administered 2023-06-27: 1 via TOPICAL

## 2023-06-27 MED ORDER — BUPIVACAINE LIPOSOME 1.3 % IJ SUSP
INTRAMUSCULAR | Status: DC | PRN
  Administered 2023-06-27: 17 mL
  Administered 2023-06-27: 33 mL

## 2023-06-27 MED ORDER — SCOPOLAMINE 1 MG/3DAYS TD PT72
MEDICATED_PATCH | TRANSDERMAL | Status: AC
  Filled 2023-06-27: qty 1

## 2023-06-27 MED ORDER — ZOLPIDEM TARTRATE 5 MG PO TABS: 5.0000 mg | ORAL_TABLET | Freq: Every evening | ORAL | Status: DC | PRN

## 2023-06-27 MED ORDER — HYDROMORPHONE HCL 1 MG/ML IJ SOLN
INTRAMUSCULAR | Status: AC
  Filled 2023-06-27: qty 1

## 2023-06-27 MED ORDER — PROPOFOL 10 MG/ML IV BOLUS
INTRAVENOUS | Status: DC | PRN
  Administered 2023-06-27: 200 mg via INTRAVENOUS

## 2023-06-27 MED ORDER — EPHEDRINE SULFATE-NACL 50-0.9 MG/10ML-% IV SOSY
PREFILLED_SYRINGE | INTRAVENOUS | Status: DC | PRN
  Administered 2023-06-27 (×2): 10 mg via INTRAVENOUS

## 2023-06-27 MED ORDER — SODIUM CHLORIDE 0.9 % IV SOLN
2.0000 g | INTRAVENOUS | Status: AC
  Administered 2023-06-27: 2 g via INTRAVENOUS

## 2023-06-27 SURGICAL SUPPLY — 83 items
ADH SKN CLS APL DERMABOND .7 (GAUZE/BANDAGES/DRESSINGS) ×4
APL SRG 38 LTWT LNG FL B (MISCELLANEOUS) ×2
APPLICATOR ARISTA FLEXITIP XL (MISCELLANEOUS) IMPLANT
BAG DRN RND TRDRP ANRFLXCHMBR (UROLOGICAL SUPPLIES) ×2
BAG URINE DRAIN 2000ML AR STRL (UROLOGICAL SUPPLIES) ×3 IMPLANT
BARRIER ADHS 3X4 INTERCEED (GAUZE/BANDAGES/DRESSINGS) IMPLANT
BLADE SURG 10 STRL SS (BLADE) IMPLANT
BRR ADH 4X3 ABS CNTRL BYND (GAUZE/BANDAGES/DRESSINGS)
CANNULA CAP OBTURATR AIRSEAL 8 (CAP) IMPLANT
CATH FOLEY 3WAY 5CC 16FR (CATHETERS) ×3 IMPLANT
CAUTERY HOOK MNPLR 1.6 DVNC XI (INSTRUMENTS) IMPLANT
CELLS DAT CNTRL 66122 CELL SVR (MISCELLANEOUS) IMPLANT
COVER BACK TABLE 60X90IN (DRAPES) ×3 IMPLANT
COVER TIP SHEARS 8 DVNC (MISCELLANEOUS) ×3 IMPLANT
DEFOGGER SCOPE WARMER CLEARIFY (MISCELLANEOUS) ×3 IMPLANT
DERMABOND ADVANCED .7 DNX12 (GAUZE/BANDAGES/DRESSINGS) ×3 IMPLANT
DILATOR CANAL MILEX (MISCELLANEOUS) ×3 IMPLANT
DRAPE ARM DVNC X/XI (DISPOSABLE) ×12 IMPLANT
DRAPE COLUMN DVNC XI (DISPOSABLE) ×3 IMPLANT
DRAPE SURG IRRIG POUCH 19X23 (DRAPES) ×3 IMPLANT
DRAPE UTILITY XL STRL (DRAPES) ×3 IMPLANT
DRIVER NDL MEGA 8 DVNC XI (INSTRUMENTS) ×3 IMPLANT
DRIVER NDLE MEGA DVNC XI (INSTRUMENTS) ×2 IMPLANT
DRSG OPSITE POSTOP 4X10 (GAUZE/BANDAGES/DRESSINGS) IMPLANT
DURAPREP 26ML APPLICATOR (WOUND CARE) ×3 IMPLANT
ELECT REM PT RETURN 9FT ADLT (ELECTROSURGICAL) ×2
ELECTRODE REM PT RTRN 9FT ADLT (ELECTROSURGICAL) ×3 IMPLANT
FORCEPS BPLR LNG DVNC XI (INSTRUMENTS) ×3 IMPLANT
FORCEPS PROGRASP DVNC XI (FORCEP) IMPLANT
GAUZE 4X4 16PLY ~~LOC~~+RFID DBL (SPONGE) IMPLANT
GLOVE BIOGEL M 6.5 STRL (GLOVE) ×9 IMPLANT
GLOVE BIOGEL PI IND STRL 6.5 (GLOVE) ×9 IMPLANT
GRASPER COBRA DVNC RU (INSTRUMENTS) ×3 IMPLANT
HEMOSTAT ARISTA ABSORB 3G PWDR (HEMOSTASIS) IMPLANT
HOLDER FOLEY CATH W/STRAP (MISCELLANEOUS) IMPLANT
IRRIG SUCT STRYKERFLOW 2 WTIP (MISCELLANEOUS) ×2
IRRIGATION SUCT STRKRFLW 2 WTP (MISCELLANEOUS) ×3 IMPLANT
KIT PINK PAD W/HEAD ARE REST (MISCELLANEOUS) ×2
KIT PINK PAD W/HEAD ARM REST (MISCELLANEOUS) ×3 IMPLANT
KIT TURNOVER CYSTO (KITS) ×3 IMPLANT
LEGGING LITHOTOMY PAIR STRL (DRAPES) ×3 IMPLANT
MANIFOLD NEPTUNE II (INSTRUMENTS) ×3 IMPLANT
OBTURATOR OPTICAL STND 8 DVNC (TROCAR) ×2
OBTURATOR OPTICALSTD 8 DVNC (TROCAR) ×3 IMPLANT
OCCLUDER COLPOPNEUMO (BALLOONS) ×3 IMPLANT
PACK ROBOT WH (CUSTOM PROCEDURE TRAY) ×3 IMPLANT
PACK ROBOTIC GOWN (GOWN DISPOSABLE) ×3 IMPLANT
PAD OB MATERNITY 4.3X12.25 (PERSONAL CARE ITEMS) ×3 IMPLANT
PAD PREP 24X48 CUFFED NSTRL (MISCELLANEOUS) ×3 IMPLANT
PROTECTOR NERVE ULNAR (MISCELLANEOUS) ×3 IMPLANT
RETRACTOR WND ALEXIS 18 MED (MISCELLANEOUS) IMPLANT
RTRCTR WOUND ALEXIS 18CM MED (MISCELLANEOUS)
RTRCTR WOUND ALEXIS 18CM SML (INSTRUMENTS)
SAVER CELL AAL HAEMONETICS (INSTRUMENTS) IMPLANT
SCISSORS LAP 5X45 EPIX DISP (ENDOMECHANICALS) IMPLANT
SCISSORS MNPLR CVD DVNC XI (INSTRUMENTS) ×3 IMPLANT
SEAL UNIV 5-12 XI (MISCELLANEOUS) ×6 IMPLANT
SEALER VESSEL EXT DVNC XI (MISCELLANEOUS) IMPLANT
SET IRRIG Y TYPE TUR BLADDER L (SET/KITS/TRAYS/PACK) IMPLANT
SET TRI-LUMEN FLTR TB AIRSEAL (TUBING) IMPLANT
SET TUBE FILTERED XL AIRSEAL (SET/KITS/TRAYS/PACK) IMPLANT
SLEEVE SCD COMPRESS KNEE MED (STOCKING) ×3 IMPLANT
SLEEVE SURGEON STRL (DRAPES) IMPLANT
SPIKE FLUID TRANSFER (MISCELLANEOUS) ×6 IMPLANT
SPONGE T-LAP 18X18 ~~LOC~~+RFID (SPONGE) IMPLANT
SUT MON AB 4-0 PS1 27 (SUTURE) IMPLANT
SUT PDS AB 0 CT 36 (SUTURE) IMPLANT
SUT VIC AB 0 CT1 27 (SUTURE) ×4
SUT VIC AB 0 CT1 27XBRD ANBCTR (SUTURE) ×6 IMPLANT
SUT VIC AB 2-0 CT1 (SUTURE) IMPLANT
SUT VICRYL 0 UR6 27IN ABS (SUTURE) IMPLANT
SUT VICRYL RAPIDE 4/0 PS 2 (SUTURE) ×6 IMPLANT
SUT VLOC 180 0 9IN GS21 (SUTURE) ×3 IMPLANT
SYS LAPSCP GELPORT 120MM (MISCELLANEOUS) ×2
SYSTEM LAPSCP GELPORT 120MM (MISCELLANEOUS) IMPLANT
TIP RUMI ORANGE 6.7MMX12CM (TIP) IMPLANT
TIP UTERINE 5.1X6CM LAV DISP (MISCELLANEOUS) IMPLANT
TIP UTERINE 6.7X10CM GRN DISP (MISCELLANEOUS) IMPLANT
TIP UTERINE 6.7X6CM WHT DISP (MISCELLANEOUS) IMPLANT
TIP UTERINE 6.7X8CM BLUE DISP (MISCELLANEOUS) IMPLANT
TOWEL OR 17X24 6PK STRL BLUE (TOWEL DISPOSABLE) ×3 IMPLANT
TROCAR PORT AIRSEAL 8X120 (TROCAR) IMPLANT
WATER STERILE IRR 1000ML POUR (IV SOLUTION) ×3 IMPLANT

## 2023-06-27 NOTE — Op Note (Signed)
06/27/2023  6:30 PM  PATIENT:  Deanna Mack  47 y.o. female  PRE-OPERATIVE DIAGNOSIS:  Fibroids Abnormal Uterine Bleeding  POST-OPERATIVE DIAGNOSIS:  FibroidsAbnormal Uterine Bleeding  PROCEDURE:  Procedure(s): XI ROBOTIC ASSISTED LAPAROSCOPIC HYSTERECTOMY AND SALPINGECTOMY (Bilateral) LAPAROTOMY (N/A)  SURGEON:  Surgeon(s) and Role: Panel 1:    Gerald Leitz, MD - Primary     Panel 2:    * Gerald Leitz, MD - Primary  PHYSICIAN ASSISTANT: None  ASSISTANTS: Freda Jackson RNFA assisted due to complexity of the anatomy    ANESTHESIA:   general  EBL:  50 mL   BLOOD ADMINISTERED:none  DRAINS: Urinary Catheter (Foley)   LOCAL MEDICATIONS USED:  OTHER ropivicaine/ exparel mixed with 25% marcaine   SPECIMEN:  Source of Specimen:  uterus cervix and bilateral fallopian tubes   DISPOSITION OF SPECIMEN:  PATHOLOGY  COUNTS:  YES  TOURNIQUET:  * No tourniquets in log *  DICTATION: .Note written in EPIC  PLAN OF CARE: Admit for overnight observation  PATIENT DISPOSITION:  PACU - hemodynamically stable.   Delay start of Pharmacological VTE agent (>24hrs) due to surgical blood loss or risk of bleeding: not applicable  Findings: enlarged fibroid uterus. Normal appearing fallopian tubes and ovaries.   Procedure: The patient was taken to the operating room where she was placed under general anesthesia.Time out was performed. Marland Kitchen She was placed in dorsal lithotomy position and prepped and draped in the usual sterile fashion. A weighted speculum was placed into the vagina. A Deaver was placed anteriorly for retraction. The anterior lip of the cervix was grasped with a single-tooth tenaculum. The vaginal mucosa was injected with 2.5 cc of ropivacaine at the 2/4/ 8 and 10 o'clock positions. The uterus was sounded to 12 cm. the cervix was dilated to 6 mm . 0 vicryl suture placed at the 12 and 6:00 positions Of the cervix to facilitate placement of a Ru mi uterine manipulator.  The manipulator was placed without difficulty. Weighted speculum and Deaver were removed .  Attention was turned to the patient's abdomen where a 8 mm trocar was placed 2 cm above the umbilicus. under direct visualization . The pneumoperitoneum was achieved with PCO2 gas. The laparoscope was removed. 60 cc of ropivacaine were injected into the abdominal cavity. The laparoscope was reinserted. An 8 mm trocar was placed in the right upper quadrant 16 centimeters from the umbilicus.later connected to robotic arm #4). An incision was made in the Right upper quadrant TROCAR WAS PLACED 8 cm from the umbilicus. Later connected to robotic arm #3. An 8 mm incision was made in the left upper quadrant 16 cm from the umbilicus and connected to robot arm #1. Marland Kitchen Attention was turned to the left upper quadrant where a 8 mm midclavicular assistant trocar was placed. ( All incision sites were injected with 10cc of ropivacaine prior to port placement. )  Once all ports had been placed under direct visualization.The laparoscope was removed and the da Vinci robotic system was thin right-sided docked. The robotic arms were connected to the corresponding trocars as listed above. The laparoscope was then reinserted. The long tip bipolar forceps were placed into port #1. The prograsp was  placed in the port #4. A vessel sealer ( alternating with scissors) was placed in port #3. All instruments were directed into the pelvis under direct visualization.  Attention was turned to the surgeons console.. The left mesosalpinx and left utero-ovarian ligament was cauterized and transected with the vessel sealer The  broad ligament was cauterized and transected with the vessel sealer .The round ligament was cauterized and transected with the vessel sealer  The anterior leaf of broad ligament was incised along the bladder reflection to the midline.  The right  mesosalpinx and right utero-ovarian ligament was cauterized and transected with the  vessel sealer. The right broad ligament was cauterized and transected with the vessel sealer. The right round ligament was cauterized and transected with the vessel sealer The broad ligament was incised to the midline. The bladder was dissected off the lower uterine segments of the cervix via sharp and blunt dissection.   The uterine arteries were skeleton bilaterally. They were  cauterized and transected with the vessel sealer The KOH ring was identified. The anterior colpotomy was performed followed by the posterior colpotomy. Once the uterus,cervix and bilateral fallopian tubes were completely excised  the robot was undocked and  the specimen was removed via mini laparotomy incision.   A  mini pfannensteihl incision was made and carried down to the layer of fascia. The fascia was  incised in the midline and dissected off of the rectus muscle. The peritoneum was entered sharpy. The specimen was removed. The fascia was closed with 0 pds. The skin was closed with 4-0 monocryl.   The robot was then redocked.  The  cobra  forceps were placed in the port #1 and the Mega  needle driver was placed in to port #3.  The vaginal cuff was closed with running suture if 0 v-lock. The pelvis was irrigated. Marland KitchenMarland KitchenMarland KitchenExcellent hemostasis was noted. Arista was placed along the vaginal cuff.  All pelvic pedicles were examined and hemostasis was noted.  All instruments removed from the ports. All ports were removed under direct Visualization. The pneumoperitoneum was released. The skin incisions were closed with 4-0 Vicryl and then covered with Derma bond.     Sponge lap and needle counts weIre correct x. The patient was awakened from anesthesia and taken to the recovery room in stable condition.

## 2023-06-27 NOTE — Anesthesia Preprocedure Evaluation (Addendum)
Anesthesia Evaluation  Patient identified by MRN, date of birth, ID band Patient awake    Reviewed: Allergy & Precautions, NPO status , Patient's Chart, lab work & pertinent test results  Airway Mallampati: II  TM Distance: >3 FB Neck ROM: Full    Dental  (+) Dental Advisory Given   Pulmonary sleep apnea and Continuous Positive Airway Pressure Ventilation    breath sounds clear to auscultation       Cardiovascular hypertension, Pt. on medications  Rhythm:Regular Rate:Normal     Neuro/Psych negative neurological ROS     GI/Hepatic negative GI ROS, Neg liver ROS,,,  Endo/Other  negative endocrine ROS    Renal/GU Renal InsufficiencyRenal disease     Musculoskeletal   Abdominal   Peds  Hematology negative hematology ROS (+)   Anesthesia Other Findings   Reproductive/Obstetrics                             Anesthesia Physical Anesthesia Plan  ASA: 2  Anesthesia Plan: General   Post-op Pain Management: Tylenol PO (pre-op)*, Toradol IV (intra-op)* and Ketamine IV*   Induction: Intravenous  PONV Risk Score and Plan: 4 or greater and Scopolamine patch - Pre-op, Midazolam, Dexamethasone and Ondansetron  Airway Management Planned: Oral ETT  Additional Equipment: None  Intra-op Plan:   Post-operative Plan: Extubation in OR  Informed Consent: I have reviewed the patients History and Physical, chart, labs and discussed the procedure including the risks, benefits and alternatives for the proposed anesthesia with the patient or authorized representative who has indicated his/her understanding and acceptance.     Dental advisory given  Plan Discussed with: CRNA  Anesthesia Plan Comments:        Anesthesia Quick Evaluation

## 2023-06-27 NOTE — Anesthesia Procedure Notes (Signed)
Procedure Name: Intubation Date/Time: 06/27/2023 3:29 PM  Performed by: Francie Massing, CRNAPre-anesthesia Checklist: Patient identified, Emergency Drugs available, Suction available and Patient being monitored Patient Re-evaluated:Patient Re-evaluated prior to induction Oxygen Delivery Method: Circle system utilized Preoxygenation: Pre-oxygenation with 100% oxygen Induction Type: IV induction Ventilation: Mask ventilation without difficulty Laryngoscope Size: Mac and 3 Grade View: Grade I Tube type: Oral Tube size: 7.0 mm Number of attempts: 1 Airway Equipment and Method: Stylet and Oral airway Placement Confirmation: ETT inserted through vocal cords under direct vision, positive ETCO2 and breath sounds checked- equal and bilateral Secured at: 22 cm Tube secured with: Tape Dental Injury: Teeth and Oropharynx as per pre-operative assessment

## 2023-06-27 NOTE — Transfer of Care (Signed)
Immediate Anesthesia Transfer of Care Note  Patient: Danila C Tarpley-Carter  Procedure(s) Performed: Procedure(s) (LRB): XI ROBOTIC ASSISTED LAPAROSCOPIC HYSTERECTOMY AND SALPINGECTOMY (Bilateral) LAPAROTOMY (N/A)  Patient Location: PACU  Anesthesia Type: General  Level of Consciousness: awake, oriented, sedated and patient cooperative  Airway & Oxygen Therapy: Patient Spontanous Breathing and Patient connected to face mask oxygen  Post-op Assessment: Report given to PACU RN and Post -op Vital signs reviewed and stable  Post vital signs: Reviewed and stable  Complications: No apparent anesthesia complications  Last Vitals:  Vitals Value Taken Time  BP 144/77 06/27/23 1826  Temp    Pulse 86 06/27/23 1828  Resp 20 06/27/23 1828  SpO2 99 % 06/27/23 1828  Vitals shown include unvalidated device data.  Last Pain:  Vitals:   06/27/23 1137  TempSrc: Oral  PainSc: 2       Patients Stated Pain Goal: 5 (06/27/23 1137)  Complications: No notable events documented.

## 2023-06-27 NOTE — H&P (Signed)
Date of Initial H&P: 06/25/2023  History reviewed, patient examined, no change in status, stable for surgery.  

## 2023-06-28 ENCOUNTER — Other Ambulatory Visit (HOSPITAL_COMMUNITY): Payer: Self-pay

## 2023-06-28 DIAGNOSIS — I10 Essential (primary) hypertension: Secondary | ICD-10-CM | POA: Diagnosis not present

## 2023-06-28 DIAGNOSIS — Z8616 Personal history of COVID-19: Secondary | ICD-10-CM | POA: Diagnosis not present

## 2023-06-28 DIAGNOSIS — G4733 Obstructive sleep apnea (adult) (pediatric): Secondary | ICD-10-CM | POA: Diagnosis not present

## 2023-06-28 DIAGNOSIS — N939 Abnormal uterine and vaginal bleeding, unspecified: Secondary | ICD-10-CM | POA: Diagnosis not present

## 2023-06-28 DIAGNOSIS — D259 Leiomyoma of uterus, unspecified: Secondary | ICD-10-CM | POA: Diagnosis not present

## 2023-06-28 DIAGNOSIS — Z79899 Other long term (current) drug therapy: Secondary | ICD-10-CM | POA: Diagnosis not present

## 2023-06-28 LAB — HEMOGLOBIN: Hemoglobin: 11.8 g/dL — ABNORMAL LOW (ref 12.0–15.0)

## 2023-06-28 MED ORDER — IBUPROFEN 200 MG PO TABS
ORAL_TABLET | ORAL | Status: AC
  Filled 2023-06-28: qty 3

## 2023-06-28 MED ORDER — OXYCODONE HCL 5 MG PO TABS
ORAL_TABLET | ORAL | Status: AC
  Filled 2023-06-28: qty 2

## 2023-06-28 MED ORDER — HYDROMORPHONE HCL 1 MG/ML IJ SOLN
INTRAMUSCULAR | Status: AC
  Filled 2023-06-28: qty 1

## 2023-06-28 MED ORDER — ACETAMINOPHEN 500 MG PO TABS
ORAL_TABLET | ORAL | Status: AC
  Filled 2023-06-28: qty 2

## 2023-06-28 NOTE — Discharge Summary (Signed)
Physician Discharge Summary  Patient ID: Deanna Mack MRN: [id number] DOB/AGE: [birth year] 47 y.o.  Admit date: 06/27/2023 Discharge date: 06/28/2023  Admission Diagnosis: 1. Abnormal uterine bleeding 2. Enlarged fibroid uterus  Discharge Diagnoses:  Principal Problem:   Abnormal uterine bleeding Active Problems:   S/P hysterectomy  Procedure(s):  1. Robotic Assisted Total Laparoscopic Hysterectomy and bilateral Salpingectomy 2. Mini Laparotomy  Discharged Condition: good  Hospital Course: Patient was admitted on 06/27/2023 for the above named procedure(s) for the above named diagnoses. Prior to hospital discharge, patient was tolerating PO, ambulating, voiding spontaneously, and pain was well-controlled. See hospital chart for specific details. Patient was discharged home in stable condition.   Consults: None  Significant Diagnostic Studies: None  Treatments: surgery: As documented above  Discharge Exam: Blood pressure (!) 140/78, pulse 88, temperature 99.4 F (37.4 C), resp. rate 20, height 5\' 7"  (1.702 m), weight 108.7 kg, last menstrual period 06/20/2023, SpO2 98%. Gen:  NAD, pleasant and cooperative Cardio:  Regular rate Pulm:  Normal work of breathing Abd:  Soft, non-distended, non-tender throughout, no rebound/guarding, laparoscopic port sites with skin glue atop, c/d/I, honeycomb dressing in place over mini laparotomy, and 4x4 with tape over one port site in the LUQ that is c/d/i Ext:  No bilateral LE edema, no bilateral calf tenderness, SCDs on and working  Disposition: Discharge disposition: 01-Home or Self Care       Discharge Instructions      Remove dressing in 72 hours   Complete by: As directed    Call MD for:  persistant nausea and vomiting   Complete by: As directed    Call MD for:  redness, tenderness, or signs of infection (pain, swelling, redness, odor or green/yellow discharge around incision site)   Complete by: As directed    Call  MD for:  severe uncontrolled pain   Complete by: As directed    Call MD for:  temperature >100.4   Complete by: As directed    Diet general   Complete by: As directed    Driving Restrictions   Complete by: As directed    Avoid driving for 1 week   Increase activity slowly   Complete by: As directed    Lifting restrictions   Complete by: As directed    Avoid lifting over 10 lbs   May shower / Bathe   Complete by: As directed    May walk up steps   Complete by: As directed    Sexual Activity Restrictions   Complete by: As directed    Avoid sex for 6 weeks and until approved by Dr. Richardson Dopp      Allergies as of 06/28/2023       Reactions   Gabapentin Other (See Comments)   Dizziness and lightheadedness.   Hydrochlorothiazide Swelling        Medication List     STOP taking these medications    ketorolac 10 MG tablet Commonly known as: TORADOL       TAKE these medications    acetaminophen 500 MG tablet Commonly known as: TYLENOL Take 2 tablets (1,000 mg total) by mouth every 8 (eight) hours as needed for mild pain or moderate pain.   cyclobenzaprine 10 MG tablet Commonly known as: FLEXERIL Take 1 tablet (10 mg) by mouth every 8 hours as needed for muscle spasms   cyclobenzaprine 10 MG tablet Commonly known as: FLEXERIL Take 1 tablet by mouth up to every 8 hours (at bedtime) as needed   ibuprofen  600 MG tablet Commonly known as: ADVIL Take 1 tablet (600 mg total) by mouth every 6 (six) hours as needed.   oxyCODONE 5 MG immediate release tablet Commonly known as: Oxy IR/ROXICODONE Take 1 tablet (5 mg total) by mouth every 4 (four) hours as needed for moderate pain.   spironolactone 50 MG tablet Commonly known as: ALDACTONE Take 1 tablet (50 mg total) by mouth daily.        Follow-up Information     Gerald Leitz, MD. Go in 2 week(s).   Specialty: Obstetrics and Gynecology Contact information: [address] [place]  [address] [phone]                 Signed: Steva Ready 06/28/2023, 7:39 AM

## 2023-06-28 NOTE — Progress Notes (Signed)
06/28/2023 10:46 AM Noted pt. Left block for phone charger in room at discharge. Pt. Contacted via phone, Stated she would return to [facility] and pickup item. Item labeled pt. Patient label and placed at front desk. Raeghan Demeter, Blanchard Kelch

## 2023-06-28 NOTE — Anesthesia Postprocedure Evaluation (Signed)
Anesthesia Post Note  Patient: Deanna Mack  Procedure(s) Performed: XI ROBOTIC ASSISTED LAPAROSCOPIC HYSTERECTOMY AND SALPINGECTOMY (Bilateral: Abdomen) LAPAROTOMY (Abdomen)     Patient location during evaluation: PACU Anesthesia Type: General Level of consciousness: awake Pain management: pain level controlled Vital Signs Assessment: post-procedure vital signs reviewed and stable Respiratory status: spontaneous breathing, nonlabored ventilation and respiratory function stable Cardiovascular status: blood pressure returned to baseline and stable Postop Assessment: no apparent nausea or vomiting Anesthetic complications: no   No notable events documented.  Last Vitals:  Vitals:   06/28/23 0607 06/28/23 0830  BP: (!) 140/78 111/62  Pulse: 88 71  Resp: 20 20  Temp: 37.4 C 37.1 C  SpO2: 98% 95%    Last Pain:  Vitals:   06/28/23 0830  TempSrc:   PainSc: 8                  Gwendolyn Mclees P Nhu Glasby

## 2023-06-29 ENCOUNTER — Encounter (HOSPITAL_BASED_OUTPATIENT_CLINIC_OR_DEPARTMENT_OTHER): Payer: Self-pay | Admitting: Obstetrics and Gynecology

## 2023-06-29 LAB — SURGICAL PATHOLOGY

## 2023-07-18 DIAGNOSIS — Z90710 Acquired absence of both cervix and uterus: Secondary | ICD-10-CM | POA: Diagnosis not present

## 2023-07-18 DIAGNOSIS — R42 Dizziness and giddiness: Secondary | ICD-10-CM | POA: Diagnosis not present

## 2023-07-18 DIAGNOSIS — I10 Essential (primary) hypertension: Secondary | ICD-10-CM | POA: Diagnosis not present

## 2023-07-18 DIAGNOSIS — R300 Dysuria: Secondary | ICD-10-CM | POA: Diagnosis not present

## 2023-07-18 DIAGNOSIS — Z4889 Encounter for other specified surgical aftercare: Secondary | ICD-10-CM | POA: Diagnosis not present

## 2023-07-24 DIAGNOSIS — Z0000 Encounter for general adult medical examination without abnormal findings: Secondary | ICD-10-CM | POA: Diagnosis not present

## 2023-07-24 DIAGNOSIS — R42 Dizziness and giddiness: Secondary | ICD-10-CM | POA: Diagnosis not present

## 2023-07-24 DIAGNOSIS — Z13220 Encounter for screening for lipoid disorders: Secondary | ICD-10-CM | POA: Diagnosis not present

## 2023-07-24 DIAGNOSIS — I10 Essential (primary) hypertension: Secondary | ICD-10-CM | POA: Diagnosis not present

## 2023-07-29 DIAGNOSIS — G4733 Obstructive sleep apnea (adult) (pediatric): Secondary | ICD-10-CM | POA: Diagnosis not present

## 2023-08-08 ENCOUNTER — Other Ambulatory Visit (HOSPITAL_COMMUNITY): Payer: Self-pay

## 2023-08-08 DIAGNOSIS — R109 Unspecified abdominal pain: Secondary | ICD-10-CM | POA: Diagnosis not present

## 2023-08-08 DIAGNOSIS — R42 Dizziness and giddiness: Secondary | ICD-10-CM | POA: Diagnosis not present

## 2023-08-08 MED ORDER — MECLIZINE HCL 25 MG PO TABS
25.0000 mg | ORAL_TABLET | Freq: Three times a day (TID) | ORAL | 0 refills | Status: AC
  Filled 2023-08-08 – 2023-08-09 (×2): qty 30, 10d supply, fill #0

## 2023-08-09 ENCOUNTER — Other Ambulatory Visit (HOSPITAL_COMMUNITY): Payer: Self-pay

## 2023-08-09 ENCOUNTER — Other Ambulatory Visit: Payer: Self-pay | Admitting: Internal Medicine

## 2023-08-09 ENCOUNTER — Ambulatory Visit
Admission: RE | Admit: 2023-08-09 | Discharge: 2023-08-09 | Disposition: A | Discharge status: Home / Self Care | Payer: Commercial Managed Care - PPO | Source: Ambulatory Visit | Attending: Internal Medicine | Admitting: Internal Medicine

## 2023-08-09 ENCOUNTER — Ambulatory Visit (HOSPITAL_BASED_OUTPATIENT_CLINIC_OR_DEPARTMENT_OTHER)
Admission: RE | Admit: 2023-08-09 | Discharge: 2023-08-09 | Disposition: A | Discharge status: Home / Self Care | Payer: Commercial Managed Care - PPO | Source: Ambulatory Visit | Attending: Internal Medicine | Admitting: Internal Medicine

## 2023-08-09 ENCOUNTER — Other Ambulatory Visit (HOSPITAL_COMMUNITY): Payer: Self-pay | Admitting: Internal Medicine

## 2023-08-09 DIAGNOSIS — R110 Nausea: Secondary | ICD-10-CM | POA: Diagnosis not present

## 2023-08-09 DIAGNOSIS — R109 Unspecified abdominal pain: Secondary | ICD-10-CM

## 2023-08-09 DIAGNOSIS — R1030 Lower abdominal pain, unspecified: Secondary | ICD-10-CM | POA: Diagnosis not present

## 2023-08-09 DIAGNOSIS — R42 Dizziness and giddiness: Secondary | ICD-10-CM

## 2023-08-10 ENCOUNTER — Other Ambulatory Visit (HOSPITAL_BASED_OUTPATIENT_CLINIC_OR_DEPARTMENT_OTHER): Payer: Commercial Managed Care - PPO

## 2023-08-10 ENCOUNTER — Other Ambulatory Visit (HOSPITAL_COMMUNITY): Payer: Self-pay

## 2023-08-22 ENCOUNTER — Encounter: Payer: Self-pay | Admitting: Neurology

## 2023-08-23 NOTE — Progress Notes (Signed)
Initial neurology clinic note  Deanna Mack MRN: [id number] DOB: [birth year]  Referring provider: Renford Dills, MD  Primary care provider: Renford Dills, MD  Reason for consult:  Dizziness/vertigo  Subjective:  This is Ms. Deanna Mack, a 47 y.o. right-handed female with a medical history of vit D deficiency, OSA, HTN, uterine fibroids, chronic low back pain, migraines who presents to neurology clinic with dizziness/vertigo. The patient is accompanied by wife, Pam.  Patient had a partial hysterectomy on 06/27/23 and 3 days later patient started noticing lightheadedness and dizziness. It would happen when she went to stand up. It has been gradually getting worse. She notices in the shower that she can get lightheaded or dizzy (maybe with hot water). When she is in the sun, her vision can get hazy or blurry. She also gets dizzy there.   She had one fall when in the kitchen going to the counter and got dizzy and fell. Daily she gets dizzy and feels she may fall. She has to catch herself. When she changes positions or moving her head, she will likely be dizzy. If she moves to fast, she will feel lightheaded and weird feeling in her stomach. She will feel room spinning as well. If she holds still, it can resolve in seconds to minutes. When she is still, she does not really have symptoms. She will also get dizzy when turning in bed, maybe more when turning to the right than the left.  She has associated headache, also progressively getting worse. These started weeks after her onset of dizziness though. She describes it as a pressure sensation in her forehead and temple areas. She endorses photophobia, phonophobia, nausea. She denies any worsening of headache with position change. She has a more remote history of migraines (about 20 years ago). The headache now is more severe. Her previous migraines were more in the back of her head. There was no previous photophobia or  phonophobia. She currently takes advil or tylenol for her headache. She does not think they help. She is taking tylenol 1-2 per day of late.   She also endorses neck pain.  She has been prescribed meclizine 25 mg q8h PRN. She does not think this has really helped.  Her grandmother had migraines. She also had a cousin with a brain tumor.  EtOH use: none Restrictive diet: no  MEDICATIONS:  Outpatient Encounter Medications as of 08/24/2023  Medication Sig   acetaminophen (TYLENOL) 500 MG tablet Take 2 tablets (1,000 mg total) by mouth every 8 (eight) hours as needed for mild pain or moderate pain.   cyclobenzaprine (FLEXERIL) 10 MG tablet Take 1 tablet (10 mg) by mouth every 8 hours as needed for muscle spasms   ibuprofen (ADVIL) 600 MG tablet Take 1 tablet (600 mg total) by mouth every 6 (six) hours as needed.   meclizine (ANTIVERT) 25 MG tablet Take 1 tablet (25 mg total) by mouth every 8 (eight) hours as needed for 10 days.   spironolactone (ALDACTONE) 50 MG tablet Take 1 tablet (50 mg total) by mouth daily.   cyclobenzaprine (FLEXERIL) 10 MG tablet Take 1 tablet by mouth up to every 8 hours (at bedtime) as needed (Patient not taking: Reported on 06/04/2023)   oxyCODONE (OXY IR/ROXICODONE) 5 MG immediate release tablet Take 1 tablet (5 mg total) by mouth every 4 (four) hours as needed for moderate pain. (Patient not taking: Reported on 08/24/2023)   [DISCONTINUED] norethindrone (AYGESTIN) 5 MG tablet Take 2 tablets (10 mg total)  by mouth daily.   No facility-administered encounter medications on file as of 08/24/2023.    PAST MEDICAL HISTORY: Past Medical History:  Diagnosis Date   COVID-19 09/2019   Family history of adverse reaction to anesthesia    Patient states her grandmother was difficult to arouse after anesthesia (when she was 47 years old.)   Hypertension    Follows w/ Dr. Renford Dills @ [facility].   Low back pain    during cycle only   Sleep apnea    Uterine fibroid     Wears glasses     PAST SURGICAL HISTORY: Past Surgical History:  Procedure Laterality Date   DIAGNOSTIC LAPAROSCOPY  1996   LAPAROTOMY N/A 06/27/2023   Procedure: LAPAROTOMY;  Surgeon: Gerald Leitz, MD;  Location: [facility];  Service: Gynecology;  Laterality: N/A;   ROBOTIC ASSISTED LAPAROSCOPIC HYSTERECTOMY AND SALPINGECTOMY Bilateral 06/27/2023   Procedure: XI ROBOTIC ASSISTED LAPAROSCOPIC HYSTERECTOMY AND SALPINGECTOMY;  Surgeon: Gerald Leitz, MD;  Location: [facility];  Service: Gynecology;  Laterality: Bilateral;   TUBAL LIGATION  2000    ALLERGIES: Allergies  Allergen Reactions   Gabapentin Other (See Comments)    Dizziness and lightheadedness.   Hydrochlorothiazide Swelling    FAMILY HISTORY: Family History  Problem Relation Age of Onset   Breast cancer Paternal Grandmother     SOCIAL HISTORY: Social History   Tobacco Use   Smoking status: Never   Smokeless tobacco: Never  Vaping Use   Vaping status: Never Used  Substance Use Topics   Alcohol use: Not Currently   Drug use: Not Currently   Social History   Social History Narrative   Are you right handed or left handed? Right   Are you currently employed ? yes   What is your current occupation? [employer]   Do you live at home alone?no   Who lives with you? wife   What type of home do you live in: 1 story or 2 story? two    Caffeine 2 - 4 cups a week     Objective:  Vital Signs:   08/24/2023 11:21 AM 08/24/2023 12:23 PM 08/24/2023 12:24 PM   Orthostatic BP 120/84 120/86 122/92  BP Location Right Arm Right Arm Right Arm  Patient Position Supine Sitting Standing  Cuff Size Normal Normal Normal  Orthostatic Pulse 66 60 75  SpO2 99 % 98 % 98 %   General: No acute distress.  Patient appears well-groomed.   Head:  Normocephalic/atraumatic Eyes:  fundi examined, disc margins clear, no obvious papilledema Neck: supple Back: No paraspinal tenderness Heart: regular rate and  rhythm Lungs: Clear to auscultation bilaterally. Vascular: No carotid bruits.  Neurological Exam: Mental status: alert and oriented, speech fluent and not dysarthric, language intact.  Cranial nerves: CN I: not tested CN II: pupils equal, round and reactive to light, visual fields intact CN III, IV, VI:  full range of motion, no nystagmus, no ptosis CN V: facial sensation intact. CN VII: upper and lower face symmetric CN VIII: hearing intact CN IX, X: uvula midline CN XI: sternocleidomastoid and trapezius muscles intact CN XII: tongue midline  Dix-Hallpike with significant dizziness/vertigo with head turned to right, but no clear nystagmus HINTS exam: Head impulse with correction, no obvious nystagmus, no skew deviation  Bulk & Tone: normal, no fasciculations. Motor:  muscle strength 5/5 throughout Deep Tendon Reflexes:  2+ throughout,  toes downgoing.   Sensation:  Diminished sensation to right lateral thigh, otherwise intact.  Finger to nose testing:  Without dysmetria.   Gait:  Normal station and stride.  Romberg negative.   Labs and Imaging review: External labs: TSH (07/24/23) wnl BMP (07/24/23) unremarkable CBC (07/24/23) unremarkable Lipid panel (07/24/23): total cholesterol 184, TG 122, LDL 109 Ferritin (09/04/22): 29.9  Imaging: CT head wo contrast (08/09/23): FINDINGS: Brain: No acute territorial infarction, hemorrhage, or intracranial mass. Age indeterminate small hypodensity within the right white Mater but some artifact limits assessment, series 2, image 17. Nonenlarged ventricles   Vascular: No hyperdense vessels.  No unexpected calcification   Skull: Normal. Negative for fracture or focal lesion.   Sinuses/Orbits: No acute finding.   Other: None   IMPRESSION: 1. Negative for hemorrhage or intracranial mass. 2. Age indeterminate small hypodensity within the right white matter but some artifact limits assessment. This could represent mild age indeterminate  small vessel ischemic change.  Assessment/Plan:  Tyrica C Mack is a 47 y.o. female who presents for evaluation of vertigo and headaches. She has a relevant medical history of vit D deficiency, OSA, HTN, uterine fibroids, chronic low back pain, migraines. Her neurological examination is pertinent for diminished sensation in left lateral thigh (present since she started work up for fibroids) and dizziness on DixHallpike testing when head is turned to right but without clear nystagmus. Available diagnostic data is significant for CT head showing possible age indeterminate hypodensity in right white matter. The etiology of patient's symptoms is currently unclear. She is having headaches and has a history of migraine, however, this headache is different and now associated with photophobia and phonophobia. This headache also started weeks after the dizziness/vertigo symptoms, so may perhaps to a reaction to, not cause of, her dizziness. Migraines are still possible as a cause though, even without headache. Her symptoms could also be inner ear related, such as from vestibular neuritis or BPPV. Finally, given the unclear hypodensity seen on CT, stroke is also possible. The hypodensity seen on CT, even if it is a stroke, would not cause dizziness. Perhaps there is another infarct in the cerebellum that could explain symptoms though. Her orthostatic vitals were negative, so this is unlikely orthostatic hypotension.  Patient also mentions a numbness of right lateral thigh that is more chronic. This sounds consistent with compression of the right lateral femoral cutaneous nerve (likely around the hip), ie: meralgia paresthetica.  PLAN: -Blood work: B1, B12. folate -MRI brain w/wo contrast -Epley maneuver instructions given for possible BPPV -May consider vestibular rehab if symptoms are not improving -For headaches: Headache prevention:  Nortriptyline 10 mg at bedtime for 1 week, 20 mg  thereafter Headache rescue:  Tylenol as needed for now. Triptan would be contraindicated in stroke Limit use of pain relievers to no more than 2 days out of week to prevent risk of rebound or medication-overuse headache. Keep headache diary -Discussed not wearing tight clothes or anything tight around the hips and weight loss as treatments for meralgia paresthetica.  -Return to clinic in 1 months  The impression above as well as the plan as outlined below were extensively discussed with the patient (in the company of wife) who voiced understanding. All questions were answered to their satisfaction.  The patient was counseled on pertinent fall precautions per the printed material provided today, and as noted under the "Patient Instructions" section below.  When available, results of the above investigations and possible further recommendations will be communicated to the patient via telephone/MyChart. Patient to call office if not contacted after expected testing turnaround time.  Total time spent reviewing records, interview, history/exam, documentation, and coordination of care on day of encounter:  75 min   Thank you for allowing me to participate in patient's care.  If I can answer any additional questions, I would be pleased to do so.  Jacquelyne Balint, MD   CC: Renford Dills, MD [address] [place] [address]  CC: Referring provider: Renford Dills, MD [address] [place],  [place] [address]

## 2023-08-24 ENCOUNTER — Encounter: Payer: Self-pay | Admitting: Neurology

## 2023-08-24 ENCOUNTER — Other Ambulatory Visit (INDEPENDENT_AMBULATORY_CARE_PROVIDER_SITE_OTHER): Payer: Commercial Managed Care - PPO

## 2023-08-24 ENCOUNTER — Ambulatory Visit (INDEPENDENT_AMBULATORY_CARE_PROVIDER_SITE_OTHER): Payer: Commercial Managed Care - PPO | Admitting: Neurology

## 2023-08-24 ENCOUNTER — Other Ambulatory Visit (HOSPITAL_COMMUNITY): Payer: Self-pay

## 2023-08-24 VITALS — Ht 67.0 in

## 2023-08-24 DIAGNOSIS — R110 Nausea: Secondary | ICD-10-CM

## 2023-08-24 DIAGNOSIS — R930 Abnormal findings on diagnostic imaging of skull and head, not elsewhere classified: Secondary | ICD-10-CM | POA: Diagnosis not present

## 2023-08-24 DIAGNOSIS — G43709 Chronic migraine without aura, not intractable, without status migrainosus: Secondary | ICD-10-CM | POA: Diagnosis not present

## 2023-08-24 DIAGNOSIS — R42 Dizziness and giddiness: Secondary | ICD-10-CM

## 2023-08-24 DIAGNOSIS — F40298 Other specified phobia: Secondary | ICD-10-CM

## 2023-08-24 DIAGNOSIS — G5711 Meralgia paresthetica, right lower limb: Secondary | ICD-10-CM

## 2023-08-24 DIAGNOSIS — H53149 Visual discomfort, unspecified: Secondary | ICD-10-CM

## 2023-08-24 LAB — FOLATE: Folate: 10 ng/mL (ref >5.9)

## 2023-08-24 LAB — VITAMIN B12: Vitamin B-12: 496 pg/mL (ref 211–911)

## 2023-08-24 MED ORDER — NORTRIPTYLINE HCL 10 MG PO CAPS
20.0000 mg | ORAL_CAPSULE | Freq: Every day | ORAL | 3 refills | Status: DC
Start: 2023-08-24 — End: 2023-10-12
  Filled 2023-08-24: qty 60, 30d supply, fill #0
  Filled 2023-09-11 – 2023-09-27 (×2): qty 60, 30d supply, fill #1

## 2023-08-24 NOTE — Patient Instructions (Addendum)
I saw you today for dizziness and headache. I am not sure the cause of your symptoms. It could be due to migraine. It could be due to a crystal in your inner ear, or brain problem.  I will investigate further with: -Blood work today -MRI brain  I am going to start a migraine prevention medication to see if this helps your headache and maybe your dizziness. It is called Nortriptyline. The most common side effects are sleepiness and dry mouth. You will take 10 mg at bedtime for 1 week. If you have no side effects, increase to 20 mg at bedtime after 1 week.  I want you to try the Epley Maneuver below to try to remove any crystals from your inner ear that may be causing dizziness.  Epley Maneuver when you have the dizziness when turning over in bed or turning your head: Can I do the Epley maneuver myself? Yes. You can perform the Epley maneuver at home on yourself. Sit on the edge of your bed and follow the steps outlined in the section below. For best results, you may need to perform the maneuver three times.  It's a good idea to ask your healthcare provider to demonstrate the Epley maneuver before you try it at home to ensure you're doing it properly. You should also ask them to confirm which ear is causing BPPV symptoms.  When do you do the Epley maneuver? While you can do the Epley maneuver anytime, many people prefer to do it just before they go to sleep. That way, if you have lingering vertigo symptoms after performing the procedure, you can sleep through them.  You should consult a healthcare provider before incorporating any new therapy into your routine.  How long does the Epley maneuver take to work? Most people report relief from BPPV symptoms immediately following a canalith repositioning procedure. However, sometimes, you might have to repeat maneuvers in order to reduce your symptoms.   I want to see you back in clinic in 1 month. I will be in touch when I have your labs and MRI  results. Please let me know if you have any questions or concerns in the meantime.  The physicians and staff at [facility] are committed to providing excellent care. You may receive a survey requesting feedback about your experience at our office. We strive to receive "very good" responses to the survey questions. If you feel that your experience would prevent you from giving the office a "very good " response, please contact our office to try to remedy the situation. We may be reached at [phone]. Thank you for taking the time out of your busy day to complete the survey.  Jacquelyne Balint, MD [facility]  Preventing Falls at [facility] are common, often dreaded events in the lives of older people. Aside from the obvious injuries and even death that may result, fall can cause wide-ranging consequences including loss of independence, mental decline, decreased activity and mobility. Younger people are also at risk of falling, especially those with chronic illnesses and fatigue.  Ways to reduce risk for falling Examine diet and medications. Warm foods and alcohol dilate blood vessels, which can lead to dizziness when standing. Sleep aids, antidepressants and pain medications can also increase the likelihood of a fall.  Get a vision exam. Poor vision, cataracts and glaucoma increase the chances of falling.  Check foot gear. Shoes should fit snugly and have a sturdy, nonskid sole and a broad, low heel  Participate in a physician-approved exercise program to build and maintain muscle strength and improve balance and coordination. Programs that use ankle weights or stretch bands are excellent for muscle-strengthening. Water aerobics programs and low-impact Tai Chi programs have also been shown to improve balance and coordination.  Increase vitamin D intake. Vitamin D improves muscle strength and increases the amount of calcium the body is able to absorb and deposit in bones.  How to  prevent falls from common hazards Floors - Remove all loose wires, cords, and throw rugs. Minimize clutter. Make sure rugs are anchored and smooth. Keep furniture in its usual place.  Chairs -- Use chairs with straight backs, armrests and firm seats. Add firm cushions to existing pieces to add height.  Bathroom - Install grab bars and non-skid tape in the tub or shower. Use a bathtub transfer bench or a shower chair with a back support Use an elevated toilet seat and/or safety rails to assist standing from a low surface. Do not use towel racks or bathroom tissue holders to help you stand.  Lighting - Make sure halls, stairways, and entrances are well-lit. Install a night light in your bathroom or hallway. Make sure there is a light switch at the top and bottom of the staircase. Turn lights on if you get up in the middle of the night. Make sure lamps or light switches are within reach of the bed if you have to get up during the night.  Kitchen - Install non-skid rubber mats near the sink and stove. Clean spills immediately. Store frequently used utensils, pots, pans between waist and eye level. This helps prevent reaching and bending. Sit when getting things out of lower cupboards.  Living room/ Bedrooms - Place furniture with wide spaces in between, giving enough room to move around. Establish a route through the living room that gives you something to hold onto as you walk.  Stairs - Make sure treads, rails, and rugs are secure. Install a rail on both sides of the stairs. If stairs are a threat, it might be helpful to arrange most of your activities on the lower level to reduce the number of times you must climb the stairs.  Entrances and doorways - Install metal handles on the walls adjacent to the doorknobs of all doors to make it more secure as you travel through the doorway.  Tips for maintaining balance Keep at least one hand free at all times. Try using a backpack or fanny pack to hold  things rather than carrying them in your hands. Never carry objects in both hands when walking as this interferes with keeping your balance.  Attempt to swing both arms from front to back while walking. This might require a conscious effort if Parkinson's disease has diminished your movement. It will, however, help you to maintain balance and posture, and reduce fatigue.  Consciously lift your feet off of the ground when walking. Shuffling and dragging of the feet is a common culprit in losing your balance.  When trying to navigate turns, use a "U" technique of facing forward and making a wide turn, rather than pivoting sharply.  Try to stand with your feet shoulder-length apart. When your feet are close together for any length of time, you increase your risk of losing your balance and falling.  Do one thing at a time. Don't try to walk and accomplish another task, such as reading or looking around. The decrease in your automatic reflexes complicates motor function, so the less  distraction, the better.  Do not wear rubber or gripping soled shoes, they might "catch" on the floor and cause tripping.  Move slowly when changing positions. Use deliberate, concentrated movements and, if needed, use a grab bar or walking aid. Count 15 seconds between each movement. For example, when rising from a seated position, wait 15 seconds after standing to begin walking.  If balance is a continuous problem, you might want to consider a walking aid such as a cane, walking stick, or walker. Once you've mastered walking with help, you might be ready to try it on your own again.

## 2023-08-24 NOTE — Addendum Note (Signed)
Addended by: Lenise Herald on: 08/24/2023 01:55 PM   Modules accepted: Orders

## 2023-08-27 ENCOUNTER — Telehealth: Payer: Self-pay | Admitting: Neurology

## 2023-08-27 DIAGNOSIS — G4733 Obstructive sleep apnea (adult) (pediatric): Secondary | ICD-10-CM | POA: Diagnosis not present

## 2023-08-27 DIAGNOSIS — Z0279 Encounter for issue of other medical certificate: Secondary | ICD-10-CM

## 2023-08-27 NOTE — Telephone Encounter (Signed)
Dr Loleta Chance filled out FLMA and I faxed them out and made copy and mailed to pt. Deanna Mack

## 2023-08-27 NOTE — Telephone Encounter (Signed)
Received FMLA paperwork by fax. Pt has paid form fee. Placed forms in Dr. Adaline Sill box

## 2023-08-28 LAB — VITAMIN B1: Vitamin B1 (Thiamine): 9 nmol/L (ref 8–30)

## 2023-08-29 ENCOUNTER — Telehealth: Payer: Self-pay | Admitting: Neurology

## 2023-08-29 NOTE — Telephone Encounter (Signed)
Called Pt and give her the number to [facility] to schedule appointment and told her they are behind in the work load.

## 2023-08-29 NOTE — Telephone Encounter (Signed)
Patient called wanting to know about the MRI appointment , if she needed to make the appointment or did they call her

## 2023-09-03 ENCOUNTER — Telehealth: Payer: Self-pay | Admitting: Neurology

## 2023-09-03 NOTE — Telephone Encounter (Signed)
Caller stated she would like a call back for clarification on FMLA paperwork. Stated what was discussed between her and Dr Loleta Chance was not what was not on paperwork

## 2023-09-03 NOTE — Telephone Encounter (Signed)
Returned patient's call. I had recently filled out FMLA paperwork and mistakenly marked intermittent leave when patient currently has continuous symptoms. The paperwork should have said that patient needed to be out until her MRI and next appointment with me. MRI is scheduled 09/30/23 and her next appointment with me is on 10/12/23. I will get this paperwork corrected and faxed back in with any explanatory paperwork that is needed.  All questions were answered.  Jacquelyne Balint, MD [facility]

## 2023-09-04 ENCOUNTER — Encounter: Payer: Self-pay | Admitting: Neurology

## 2023-09-04 NOTE — Telephone Encounter (Signed)
Dr. Loleta Chance has the FMLA  papers/

## 2023-09-06 ENCOUNTER — Ambulatory Visit: Payer: Commercial Managed Care - PPO | Admitting: Neurology

## 2023-09-11 ENCOUNTER — Other Ambulatory Visit (HOSPITAL_COMMUNITY): Payer: Self-pay

## 2023-09-11 ENCOUNTER — Other Ambulatory Visit: Payer: Self-pay

## 2023-09-26 ENCOUNTER — Encounter: Payer: Self-pay | Admitting: Neurology

## 2023-09-26 DIAGNOSIS — G4733 Obstructive sleep apnea (adult) (pediatric): Secondary | ICD-10-CM | POA: Diagnosis not present

## 2023-09-27 ENCOUNTER — Other Ambulatory Visit (HOSPITAL_COMMUNITY): Payer: Self-pay

## 2023-09-28 ENCOUNTER — Other Ambulatory Visit: Payer: Self-pay

## 2023-09-28 NOTE — Progress Notes (Signed)
NEUROLOGY FOLLOW UP OFFICE NOTE  Cortlyn Hegel Mack [id number]  Subjective:  Deanna Mack is a 47 y.o. year old right-handed female with a medical history of vit D deficiency, OSA, HTN, uterine fibroids, chronic low back pain, migraines who we last saw on 08/24/23 for dizziness and headaches.  To briefly review: Patient had a partial hysterectomy on 06/27/23 and 3 days later patient started noticing lightheadedness and dizziness. It would happen when she went to stand up. It has been gradually getting worse. She notices in the shower that she can get lightheaded or dizzy (maybe with hot water). When she is in the sun, her vision can get hazy or blurry. She also gets dizzy there.    She had one fall when in the kitchen going to the counter and got dizzy and fell. Daily she gets dizzy and feels she may fall. She has to catch herself. When she changes positions or moving her head, she will likely be dizzy. If she moves to fast, she will feel lightheaded and weird feeling in her stomach. She will feel room spinning as well. If she holds still, it can resolve in seconds to minutes. When she is still, she does not really have symptoms. She will also get dizzy when turning in bed, maybe more when turning to the right than the left.   She has associated headache, also progressively getting worse. These started weeks after her onset of dizziness though. She describes it as a pressure sensation in her forehead and temple areas. She endorses photophobia, phonophobia, nausea. She denies any worsening of headache with position change. She has a more remote history of migraines (about 20 years ago). The headache now is more severe. Her previous migraines were more in the back of her head. There was no previous photophobia or phonophobia. She currently takes advil or tylenol for her headache. She does not think they help. She is taking tylenol 1-2 per day of late.    She also endorses neck  pain.   She has been prescribed meclizine 25 mg q8h PRN. She does not think this has really helped.   Her grandmother had migraines. She also had a cousin with a brain tumor.   EtOH use: none Restrictive diet: no  Most recent Assessment and Plan (08/24/23): Deanna Mack is a 47 y.o. female who presents for evaluation of vertigo and headaches. She has a relevant medical history of vit D deficiency, OSA, HTN, uterine fibroids, chronic low back pain, migraines. Her neurological examination is pertinent for diminished sensation in left lateral thigh (present since she started work up for fibroids) and dizziness on DixHallpike testing when head is turned to right but without clear nystagmus. Available diagnostic data is significant for CT head showing possible age indeterminate hypodensity in right white matter. The etiology of patient's symptoms is currently unclear. She is having headaches and has a history of migraine, however, this headache is different and now associated with photophobia and phonophobia. This headache also started weeks after the dizziness/vertigo symptoms, so may perhaps to a reaction to, not cause of, her dizziness. Migraines are still possible as a cause though, even without headache. Her symptoms could also be inner ear related, such as from vestibular neuritis or BPPV. Finally, given the unclear hypodensity seen on CT, stroke is also possible. The hypodensity seen on CT, even if it is a stroke, would not cause dizziness. Perhaps there is another infarct in the cerebellum that could explain symptoms though. Her  orthostatic vitals were negative, so this is unlikely orthostatic hypotension.   Patient also mentions a numbness of right lateral thigh that is more chronic. This sounds consistent with compression of the right lateral femoral cutaneous nerve (likely around the hip), ie: meralgia paresthetica.   PLAN: -Blood work: B1, B12. folate -MRI brain w/wo  contrast -Epley maneuver instructions given for possible BPPV -May consider vestibular rehab if symptoms are not improving -For headaches: Headache prevention:  Nortriptyline 10 mg at bedtime for 1 week, 20 mg thereafter Headache rescue:  Tylenol as needed for now. Triptan would be contraindicated in stroke Limit use of pain relievers to no more than 2 days out of week to prevent risk of rebound or medication-overuse headache. Keep headache diary -Discussed not wearing tight clothes or anything tight around the hips and weight loss as treatments for meralgia paresthetica.  Since their last visit: Regarding her vertigo, she still has it intermittently. She tends to get it more when changing positions. She also mentions getting lightheaded when she reaches up to grab something above her head.  She feels her hearing is muffled, as if she is in a pool. She is not sure which ear this was in. She also endorses tinnitus in her right ear. She also endorses significant anxiety that has worsened of late with chest pain and a lot of emotions since her symptoms. She is not sure if it is all related to her medical condition or if it is more generalized.  Her headaches have improved a little. She still has extreme light sensitivity. She will get a headache (tightness in the head) when she goes out into the sun. She feels like laying down is okay for her head, but standing up makes them worse. She will feel increased pressure in her head but then go away. Her average headache is about 20-30 minutes. She is taking Nortriptyline 20 mg daily. She does not think she has clear side effects, but maybe the anxiety got worse after starting the medication.  She is still having headaches most days. She has to take tylenol for more severe headache 1-2 times per week. The headache will improve with tylenol and laying down. She also have have occasional neck pain.  She endorses some blurry vision and occasional double  vision. She denies any vision loss. She saw [facility] one week ago. She had some changes to her vision, but states they did not see any increased pressure on her eye (presumably no papilledema).   She has never seen ENT. She has not done any therapy.  Labs were normal.  MRI brain on 09/30/23 showed likely chronic lacunar infarct in right frontal lobe and partially empty sella.  MEDICATIONS:  Outpatient Encounter Medications as of 10/12/2023  Medication Sig   acetaminophen (TYLENOL) 500 MG tablet Take 2 tablets (1,000 mg total) by mouth every 8 (eight) hours as needed for mild pain or moderate pain.   cyclobenzaprine (FLEXERIL) 10 MG tablet Take 1 tablet (10 mg) by mouth every 8 hours as needed for muscle spasms   ibuprofen (ADVIL) 600 MG tablet Take 1 tablet (600 mg total) by mouth every 6 (six) hours as needed.   meclizine (ANTIVERT) 25 MG tablet Take 1 tablet (25 mg total) by mouth every 8 (eight) hours as needed for 10 days.   nortriptyline (PAMELOR) 10 MG capsule Take 1 capsule (10 mg) by mouth at bedtime for 1 week, then increase to 2 capsules (20 mg) at bedtime thereafter.  spironolactone (ALDACTONE) 50 MG tablet Take 1 tablet (50 mg total) by mouth daily.   cyclobenzaprine (FLEXERIL) 10 MG tablet Take 1 tablet by mouth up to every 8 hours (at bedtime) as needed (Patient not taking: Reported on 06/04/2023)   oxyCODONE (OXY IR/ROXICODONE) 5 MG immediate release tablet Take 1 tablet (5 mg total) by mouth every 4 (four) hours as needed for moderate pain. (Patient not taking: Reported on 08/24/2023)   [DISCONTINUED] norethindrone (AYGESTIN) 5 MG tablet Take 2 tablets (10 mg total) by mouth daily.   [DISCONTINUED] spironolactone (ALDACTONE) 50 MG tablet Take 1 tablet (50 mg total) by mouth daily.   No facility-administered encounter medications on file as of 10/12/2023.    PAST MEDICAL HISTORY: Past Medical History:  Diagnosis Date   COVID-19 09/2019   Family history of adverse  reaction to anesthesia    Patient states her grandmother was difficult to arouse after anesthesia (when she was 47 years old.)   Hypertension    Follows w/ Dr. Renford Dills @ [facility].   Low back pain    during cycle only   Sleep apnea    Uterine fibroid    Wears glasses     PAST SURGICAL HISTORY: Past Surgical History:  Procedure Laterality Date   DIAGNOSTIC LAPAROSCOPY  1996   LAPAROTOMY N/A 06/27/2023   Procedure: LAPAROTOMY;  Surgeon: Gerald Leitz, MD;  Location: [facility];  Service: Gynecology;  Laterality: N/A;   ROBOTIC ASSISTED LAPAROSCOPIC HYSTERECTOMY AND SALPINGECTOMY Bilateral 06/27/2023   Procedure: XI ROBOTIC ASSISTED LAPAROSCOPIC HYSTERECTOMY AND SALPINGECTOMY;  Surgeon: Gerald Leitz, MD;  Location: [facility];  Service: Gynecology;  Laterality: Bilateral;   TUBAL LIGATION  2000    ALLERGIES: Allergies  Allergen Reactions   Gabapentin Other (See Comments)    Dizziness and lightheadedness.   Hydrochlorothiazide Swelling    FAMILY HISTORY: Family History  Problem Relation Age of Onset   Breast cancer Paternal Grandmother     SOCIAL HISTORY: Social History   Tobacco Use   Smoking status: Never   Smokeless tobacco: Never  Vaping Use   Vaping status: Never Used  Substance Use Topics   Alcohol use: Not Currently   Drug use: Not Currently   Social History   Social History Narrative   Are you right handed or left handed? Right   Are you currently employed ? yes   What is your current occupation? [employer]   Do you live at home alone?no   Who lives with you? wife   What type of home do you live in: 1 story or 2 story? two    Caffeine 2 - 4 cups a week       Objective:  Vital Signs:  BP (!) 154/91   Pulse (!) 116   Ht 5\' 7"  (1.702 m)   Wt 237 lb (107.5 kg)   SpO2 99%   BMI 37.12 kg/m   General: No acute distress.  Patient appears well-groomed.   Head:  Normocephalic/atraumatic Eyes:  fundi examined, disc  margins clear, no obvious papilledema Neck: supple, range of motion difficult to access as patient prefers not to turn head due to dizziness Lungs: Non-labored breathing on room air   Neurological Exam: Mental status: alert and oriented, speech fluent and not dysarthric, language intact.  Cranial nerves: CN I: not tested CN II: pupils equal, round and reactive to light, visual fields intact CN III, IV, VI:  full range of motion, no nystagmus, no ptosis CN  V: facial sensation intact. CN VII: upper and lower face symmetric CN VIII: hearing intact CN IX, X: uvula midline CN XI: sternocleidomastoid and trapezius muscles intact CN XII: tongue midline  Bulk & Tone: normal, no fasciculations. Motor:  muscle strength 5/5 throughout Deep Tendon Reflexes:  2+ throughout.   Sensation:  Light touch sensation intact.  Gait:  Normal station and stride.   Labs and Imaging review: New results: 08/24/23: B1 wnl B12: 496 Folate wnl  MRI brain w/wo contrast (09/30/23): FINDINGS: Brain:   Cerebral volume is normal.   Subcentimeter T2 hyperintense focus within the mid right frontal lobe white matter with mild surrounding gliosis (series 111, image 25) (series 112, image 19).   There are a few tiny nonspecific foci of T2 FLAIR hyperintense signal abnormality scattered elsewhere within the bilateral cerebral white matter.   Partially empty sella turcica.   There is no acute infarct.   No evidence of an intracranial mass.   No extra-axial fluid collection.   No midline shift.   No pathologic intracranial enhancement identified.   Vascular: Maintained flow voids within the proximal large arterial vessels.   Skull and upper cervical spine: Abnormal T1 hypointense marrow signal within the calvarium and imaged portions of the cervical spine.   Sinuses/Orbits: No mass or acute finding within the imaged orbits. No significant paranasal sinus disease.   IMPRESSION: 1.  No evidence  of an acute intracranial abnormality. 2. Subcentimeter focus within the mid right frontal lobe white matter favored to reflect a chronic lacunar infarct (rather than a prominent perivascular space). This corresponds with the finding described on the prior head CT of 08/09/2023. 3. Few tiny foci of T2 FLAIR hyperintense signal abnormality elsewhere within the cerebral white matter. Findings are nonspecific and differential considerations include early/minimal chronic small vessel ischemic disease and sequelae of chronic migraine headaches, among others. 4. Partially empty sella turcica. This finding can reflect incidental anatomic variation, or alternatively, it can be associated with chronic idiopathic intracranial hypertension (pseudotumor cerebri). 5. Abnormal T1 hypointense marrow signal within the calvarium and imaged portions of the cervical spine. While this finding can reflect a marrow infiltrative process, the most common causes include chronic anemia, smoking and obesity.  Previously reviewed results: External labs: TSH (07/24/23) wnl BMP (07/24/23) unremarkable CBC (07/24/23) unremarkable Lipid panel (07/24/23): total cholesterol 184, TG 122, LDL 109 Ferritin (09/04/22): 29.9   Imaging: CT head wo contrast (08/09/23): FINDINGS: Brain: No acute territorial infarction, hemorrhage, or intracranial mass. Age indeterminate small hypodensity within the right white Mater but some artifact limits assessment, series 2, image 17. Nonenlarged ventricles   Vascular: No hyperdense vessels.  No unexpected calcification   Skull: Normal. Negative for fracture or focal lesion.   Sinuses/Orbits: No acute finding.   Other: None   IMPRESSION: 1. Negative for hemorrhage or intracranial mass. 2. Age indeterminate small hypodensity within the right white matter but some artifact limits assessment. This could represent mild age indeterminate small vessel ischemic change.  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 MRI brain with no abnormalities to explain her symptoms. I am most concerned for inner ear pathology, particularly with hearing changes and tinnitus. She will benefit from ENT referral and consideration of vestibular testing. Headaches - associated with photophobia, phonophobia, nausea, and cervicalgia. If anything, her headaches are worse when standing, not worse laying down. There is also no evidence of papilledema. MRI showing partially  empty sella may be normal finding, thought IIH is still a consideration. Patient thinks she is improving with nortriptyline though. How or if headache is related to vertigo is currently unclear.  Plan: -Discussed LP - will hold off for now -Will request records from [facility] -Referral to ENT -Referral to vestibular Rehab  For presumed migraines: Migraine prevention:  Patient prefers to continue nortriptyline for headache prevention. Will increase to 30 mg daily. Migraine rescue:  Tylenol as needed Limit use of pain relievers to no more than 2 days out of week to prevent risk of rebound or medication-overuse headache. Keep headache diary  Return to clinic in 3 months  Total time spent reviewing records, interview, history/exam, documentation, and coordination of care on day of encounter:  65 min  Jacquelyne Balint, MD

## 2023-09-30 ENCOUNTER — Ambulatory Visit
Admission: RE | Admit: 2023-09-30 | Discharge: 2023-09-30 | Disposition: A | Discharge status: Home / Self Care | Payer: Commercial Managed Care - PPO | Source: Ambulatory Visit | Attending: Neurology | Admitting: Neurology

## 2023-09-30 DIAGNOSIS — R42 Dizziness and giddiness: Secondary | ICD-10-CM | POA: Diagnosis not present

## 2023-09-30 DIAGNOSIS — R9082 White matter disease, unspecified: Secondary | ICD-10-CM | POA: Diagnosis not present

## 2023-09-30 DIAGNOSIS — R930 Abnormal findings on diagnostic imaging of skull and head, not elsewhere classified: Secondary | ICD-10-CM

## 2023-09-30 DIAGNOSIS — I6389 Other cerebral infarction: Secondary | ICD-10-CM | POA: Diagnosis not present

## 2023-09-30 DIAGNOSIS — G5711 Meralgia paresthetica, right lower limb: Secondary | ICD-10-CM

## 2023-09-30 DIAGNOSIS — G43709 Chronic migraine without aura, not intractable, without status migrainosus: Secondary | ICD-10-CM

## 2023-09-30 DIAGNOSIS — R110 Nausea: Secondary | ICD-10-CM

## 2023-09-30 DIAGNOSIS — F40298 Other specified phobia: Secondary | ICD-10-CM

## 2023-09-30 DIAGNOSIS — H53149 Visual discomfort, unspecified: Secondary | ICD-10-CM

## 2023-09-30 MED ORDER — GADOPICLENOL 0.5 MMOL/ML IV SOLN
10.0000 mL | Freq: Once | INTRAVENOUS | Status: AC | PRN
  Administered 2023-09-30: 10 mL via INTRAVENOUS

## 2023-10-02 ENCOUNTER — Other Ambulatory Visit (HOSPITAL_COMMUNITY): Payer: Self-pay

## 2023-10-02 MED ORDER — SPIRONOLACTONE 50 MG PO TABS
50.0000 mg | ORAL_TABLET | Freq: Every day | ORAL | 1 refills | Status: DC
  Filled 2023-10-02 – 2023-10-25 (×2): qty 90, 90d supply, fill #0
  Filled 2024-01-28: qty 30, 30d supply, fill #1
  Filled 2024-04-23: qty 30, 30d supply, fill #2
  Filled 2024-06-03: qty 30, 30d supply, fill #3

## 2023-10-03 ENCOUNTER — Telehealth: Payer: Self-pay | Admitting: Neurology

## 2023-10-03 NOTE — Telephone Encounter (Signed)
Called patient to discuss the results of her MRI brain. It did show a chronic lacunar infarct as previously suggested on CT head. I explained that this small infarct is likely not the cause of her symptoms. Her MRI also showed a partially empty sella. Patient does have headaches and vision changes, raising the possibility of IIH. Her headaches tend to improve with laying down though, which is the opposite of what would be expected in IIH. She has not had a recent eye exam. I recommended she see her eye doctor for dilated fundoscopy to look for papilledema. I also brought up the possibility of LP as her symptoms have not significantly improved with migraine medication.  Patient will follow up with me next week as planned.   All questions were answered.  Jacquelyne Balint, MD [facility]

## 2023-10-09 ENCOUNTER — Telehealth: Payer: Self-pay

## 2023-10-12 ENCOUNTER — Other Ambulatory Visit (HOSPITAL_COMMUNITY): Payer: Self-pay

## 2023-10-12 ENCOUNTER — Encounter: Payer: Self-pay | Admitting: Neurology

## 2023-10-12 ENCOUNTER — Telehealth: Payer: Self-pay | Admitting: Neurology

## 2023-10-12 ENCOUNTER — Ambulatory Visit (INDEPENDENT_AMBULATORY_CARE_PROVIDER_SITE_OTHER): Payer: Commercial Managed Care - PPO | Admitting: Neurology

## 2023-10-12 VITALS — BP 118/78 | HR 98 | Ht 67.0 in | Wt 237.0 lb

## 2023-10-12 DIAGNOSIS — H53149 Visual discomfort, unspecified: Secondary | ICD-10-CM

## 2023-10-12 DIAGNOSIS — F40298 Other specified phobia: Secondary | ICD-10-CM | POA: Diagnosis not present

## 2023-10-12 DIAGNOSIS — M542 Cervicalgia: Secondary | ICD-10-CM | POA: Diagnosis not present

## 2023-10-12 DIAGNOSIS — R110 Nausea: Secondary | ICD-10-CM

## 2023-10-12 DIAGNOSIS — R42 Dizziness and giddiness: Secondary | ICD-10-CM

## 2023-10-12 DIAGNOSIS — R930 Abnormal findings on diagnostic imaging of skull and head, not elsewhere classified: Secondary | ICD-10-CM | POA: Diagnosis not present

## 2023-10-12 DIAGNOSIS — G43709 Chronic migraine without aura, not intractable, without status migrainosus: Secondary | ICD-10-CM

## 2023-10-12 MED ORDER — NORTRIPTYLINE HCL 10 MG PO CAPS
30.0000 mg | ORAL_CAPSULE | Freq: Every day | ORAL | 5 refills | Status: DC
Start: 2023-10-12 — End: 2024-02-01
  Filled 2023-10-12 – 2023-10-25 (×2): qty 90, 30d supply, fill #0
  Filled 2023-11-23: qty 90, 30d supply, fill #1
  Filled 2023-11-24: qty 90, 30d supply, fill #0
  Filled 2023-12-25: qty 90, 30d supply, fill #1
  Filled 2024-01-28: qty 90, 30d supply, fill #0

## 2023-10-12 NOTE — Telephone Encounter (Signed)
Pt gave [place] accomodation paperwork to be filled out. Forms are in Dr. Adaline Sill box

## 2023-10-12 NOTE — Patient Instructions (Addendum)
We will increase your Nortriptyline to 30 mg at bedtime (3 tablets) to see if this helps more with your headaches.  We discussed lumbar puncture, but you preferred to hold off for now, which is reasonable. We can come back to this if needed. I'm not sure high pressure is the cause of your headaches as you have more migraine sounding headaches.  I will refer you to ENT to evaluate your dizziness as this seems to be inner ear related to me.  I am also referring you to vestibular rehab to help with the dizziness.  I will see you back in clinic in 3 months. Please let me know if you have any questions or concerns in the meantime.   The physicians and staff at [facility] are committed to providing excellent care. You may receive a survey requesting feedback about your experience at our office. We strive to receive "very good" responses to the survey questions. If you feel that your experience would prevent you from giving the office a "very good " response, please contact our office to try to remedy the situation. We may be reached at [phone]. Thank you for taking the time out of your busy day to complete the survey.  Jacquelyne Balint, MD [facility]

## 2023-10-13 ENCOUNTER — Other Ambulatory Visit (HOSPITAL_COMMUNITY): Payer: Self-pay

## 2023-10-15 NOTE — Telephone Encounter (Signed)
Dr. Loleta Chance has filled them out and I put up front for them to call and collect money. Given to [place]

## 2023-10-18 ENCOUNTER — Telehealth: Payer: Self-pay

## 2023-10-18 NOTE — Telephone Encounter (Signed)
Called Louise at [facility] and left a message x 2 - one just calling to see if it was the right number and to tell her Dr. Loleta Chance would have to answer the question and he is in clinic at the time. Called second time and informed on message of answer per Dr. Loleta Chance .

## 2023-10-24 ENCOUNTER — Telehealth: Payer: Self-pay | Admitting: Neurology

## 2023-10-24 ENCOUNTER — Telehealth: Payer: Self-pay | Admitting: Physical Therapy

## 2023-10-24 ENCOUNTER — Ambulatory Visit: Payer: Commercial Managed Care - PPO | Admitting: Physical Therapy

## 2023-10-24 DIAGNOSIS — R42 Dizziness and giddiness: Secondary | ICD-10-CM

## 2023-10-24 DIAGNOSIS — Z0279 Encounter for issue of other medical certificate: Secondary | ICD-10-CM

## 2023-10-24 NOTE — Therapy (Signed)
OUTPATIENT PHYSICAL THERAPY VESTIBULAR EVALUATION- ARRIVED NO CHARGE     Patient Name: Deanna Mack MRN: [id number] DOB:21-Jul-1976, 47 y.o., female Today's Date: 10/24/2023  END OF SESSION:   Past Medical History:  Diagnosis Date   COVID-19 09/2019   Family history of adverse reaction to anesthesia    Patient states her grandmother was difficult to arouse after anesthesia (when she was 47 years old.)   Hypertension    Follows w/ Dr. Renford Dills @ [facility].   Low back pain    during cycle only   Sleep apnea    Uterine fibroid    Wears glasses    Past Surgical History:  Procedure Laterality Date   DIAGNOSTIC LAPAROSCOPY  1996   LAPAROTOMY N/A 06/27/2023   Procedure: LAPAROTOMY;  Surgeon: Gerald Leitz, MD;  Location: [facility];  Service: Gynecology;  Laterality: N/A;   ROBOTIC ASSISTED LAPAROSCOPIC HYSTERECTOMY AND SALPINGECTOMY Bilateral 06/27/2023   Procedure: XI ROBOTIC ASSISTED LAPAROSCOPIC HYSTERECTOMY AND SALPINGECTOMY;  Surgeon: Gerald Leitz, MD;  Location: [facility];  Service: Gynecology;  Laterality: Bilateral;   TUBAL LIGATION  2000   Patient Active Problem List   Diagnosis Date Noted   Abnormal uterine bleeding 06/27/2023   S/P hysterectomy 06/27/2023    PCP: Renford Dills, MD  REFERRING PROVIDER: Antony Madura, MD  REFERRING DIAG: [phone] (ICD-10-CM) - Chronic migraine without aura without status migrainosus, not intractable R42 (ICD-10-CM) - Vertigo H53.149 (ICD-10-CM) - Photophobia R93.0 (ICD-10-CM) - Abnormal CT of the head F40.298 (ICD-10-CM) - Phonophobia R11.0 (ICD-10-CM) - Nausea M54.2 (ICD-10-CM) - Cervicalgia  THERAPY DIAG:  No diagnosis found.  ONSET DATE: 10/12/2023  Rationale for Evaluation and Treatment: Rehabilitation  SUBJECTIVE:   SUBJECTIVE STATEMENT: Reports headaches are starting to get better with the  nortriptyline. Feels head pressure and ear pressure. Feels muffling in ears and  feels like they are stuffed up. Has had some ringing in the R ear. Has been going to [facility] physical therapy for eval last week for vertigo and one treatment session.  Discussed with pt that she can only be seen at one PT clinic for the same diagnosis. Pt would rather be seen here at [facility]. Discussed with pt that she will need a new referral to be seen at this clinic for a new vertigo evaluation. Pt verbalized understanding. PT to send new referral request to Dr. Loleta Chance.    Pt accompanied by: self  PERTINENT HISTORY: PMH: D deficiency, OSA, HTN, uterine fibroids, chronic low back pain, migraines  Per Dr. Loleta Chance on 08/24/23: Patient had a partial hysterectomy on 06/27/23 and 3 days later patient started noticing lightheadedness and dizziness. It would happen when she went to stand up. It has been gradually getting worse. She notices in the shower that she can get lightheaded or dizzy (maybe with hot water). When she is in the sun, her vision can get hazy or blurry. She also gets dizzy there.    She had one fall when in the kitchen going to the counter and got dizzy and fell. Daily she gets dizzy and feels she may fall. She has to catch herself. When she changes positions or moving her head, she will likely be dizzy. If she moves to fast, she will feel lightheaded and weird feeling in her stomach. She will feel room spinning as well. If she holds still, it can resolve in seconds to minutes. When she is still, she does not really have symptoms. She will also get  dizzy when turning in bed, maybe more when turning to the right than the left.   She has associated headache, also progressively getting worse. These started weeks after her onset of dizziness though. She describes it as a pressure sensation in her forehead and temple areas. She endorses photophobia, phonophobia, nausea. She denies any worsening of headache with position change. She has a more remote history of migraines (about 20  years ago). The headache now is more severe. Her previous migraines were more in the back of her head. There was no previous photophobia or phonophobia. She currently takes advil or tylenol for her headache. She does not think they help. She is taking tylenol 1-2 per day of late.      Drake Leach, PT, DPT 10/24/2023, 12:27 PM

## 2023-10-24 NOTE — Telephone Encounter (Signed)
Received FMLA paperwork via fax. Spoke with patient and collected $25 form fee. She would like them faxed in once they are complete. Forms are in Dr. Adaline Sill box

## 2023-10-24 NOTE — Telephone Encounter (Signed)
[facility] is requesting another referral to them for the pt's PT for vertigo. The pt has been getting it with another company and is wanting to go there. There is some kind of confusion with the previous referral.

## 2023-10-24 NOTE — Telephone Encounter (Signed)
Form Fee has been paid

## 2023-10-24 NOTE — Telephone Encounter (Signed)
Dr. Loleta Chance,  Your patient Deanna Mack was scheduled for a vestibular evaluation at [facility] this afternoon. She states that she went to another PT clinic [facility]) last week for a vertigo evaluation and was seen for one treatment there, but she wants to be seen at our clinic for PT for vertigo.   Can you please send a new order over to our clinic for vertigo, dizziness, and cervicalgia so we can perform a new evaluation here and that insurance will cover it?   Let me know if you have any questions!  Thanks so much, Sherlie Ban, PT, DPT 10/24/23 3:27 PM   [facility] [address] [place], [place]  [address] Phone:  [phone] Fax:  [phone]

## 2023-10-24 NOTE — Telephone Encounter (Signed)
Papers were filled out by Dr. Loleta Chance and faxed today. 10/24/23.

## 2023-10-25 ENCOUNTER — Other Ambulatory Visit: Payer: Self-pay

## 2023-10-25 ENCOUNTER — Other Ambulatory Visit (HOSPITAL_COMMUNITY): Payer: Self-pay

## 2023-10-25 DIAGNOSIS — R42 Dizziness and giddiness: Secondary | ICD-10-CM

## 2023-10-25 DIAGNOSIS — M542 Cervicalgia: Secondary | ICD-10-CM

## 2023-10-25 NOTE — Telephone Encounter (Signed)
Clarified order via Chloe G at [facility] and put them in Per Dr. Loleta Chance. New orders sent.

## 2023-11-02 ENCOUNTER — Ambulatory Visit: Payer: Commercial Managed Care - PPO | Admitting: Physical Therapy

## 2023-11-12 ENCOUNTER — Ambulatory Visit: Payer: Commercial Managed Care - PPO | Attending: Neurology | Admitting: Physical Therapy

## 2023-11-12 DIAGNOSIS — H8112 Benign paroxysmal vertigo, left ear: Secondary | ICD-10-CM | POA: Insufficient documentation

## 2023-11-12 DIAGNOSIS — R42 Dizziness and giddiness: Secondary | ICD-10-CM | POA: Insufficient documentation

## 2023-11-14 ENCOUNTER — Ambulatory Visit: Payer: Commercial Managed Care - PPO | Admitting: Physical Therapy

## 2023-11-14 VITALS — BP 129/91 | HR 83

## 2023-11-14 DIAGNOSIS — H8112 Benign paroxysmal vertigo, left ear: Secondary | ICD-10-CM | POA: Diagnosis present

## 2023-11-14 DIAGNOSIS — R42 Dizziness and giddiness: Secondary | ICD-10-CM

## 2023-11-14 NOTE — Therapy (Signed)
OUTPATIENT PHYSICAL THERAPY VESTIBULAR EVALUATION     Patient Name: Deanna Mack MRN: [id number] DOB:31-Jul-1976, 47 y.o., female Today's Date: 11/14/2023  END OF SESSION:  PT End of Session - 11/14/23 1103     Visit Number 1    Number of Visits 5    Date for PT Re-Evaluation 12/14/23    Authorization Type [employer]    PT Start Time 1102    PT Stop Time 1155    PT Time Calculation (min) 53 min    Equipment Utilized During Treatment Gait belt    Activity Tolerance Patient tolerated treatment well   limited by dizziness/not feeling well after maneuver   Behavior During Therapy WFL for tasks assessed/performed             Past Medical History:  Diagnosis Date   COVID-19 09/2019   Family history of adverse reaction to anesthesia    Patient states her grandmother was difficult to arouse after anesthesia (when she was 47 years old.)   Hypertension    Follows w/ Dr. Renford Dills @ [facility].   Low back pain    during cycle only   Sleep apnea    Uterine fibroid    Wears glasses    Past Surgical History:  Procedure Laterality Date   DIAGNOSTIC LAPAROSCOPY  1996   LAPAROTOMY N/A 06/27/2023   Procedure: LAPAROTOMY;  Surgeon: Gerald Leitz, MD;  Location: [facility];  Service: Gynecology;  Laterality: N/A;   ROBOTIC ASSISTED LAPAROSCOPIC HYSTERECTOMY AND SALPINGECTOMY Bilateral 06/27/2023   Procedure: XI ROBOTIC ASSISTED LAPAROSCOPIC HYSTERECTOMY AND SALPINGECTOMY;  Surgeon: Gerald Leitz, MD;  Location: [facility];  Service: Gynecology;  Laterality: Bilateral;   TUBAL LIGATION  2000   Patient Active Problem List   Diagnosis Date Noted   Abnormal uterine bleeding 06/27/2023   S/P hysterectomy 06/27/2023    PCP: Renford Dills, MD  REFERRING PROVIDER: Antony Madura, MD  REFERRING DIAG: R42 (ICD-10-CM) - Vertigo M54.2 (ICD-10-CM) - Cervicalgia R42 (ICD-10-CM) - Dizziness   THERAPY DIAG:  Dizziness and  giddiness  BPPV (benign paroxysmal positional vertigo), left  ONSET DATE: 10/25/2023   Rationale for Evaluation and Treatment: Rehabilitation  SUBJECTIVE:   SUBJECTIVE STATEMENT: Reports headaches are starting to get better with the nortriptyline. Feels head pressure and ear pressure. Feels muffling in ears and feels like they are stuffed up. Has had some ringing in the R ear. Any sudden moves going from sitting to standing or making sudden movements will make her lightheaded/dizzy. Sometimes in the shower will get a dizziness feeling if she tilts her head too far back. When she raises her arms up too high will get a wooziness feeling. Clings to the wall when she is in the shower. Can't drive or really see at night, everything looks blurry. Can't move too quick in the bed or else will get dizzy. If she moves to the L, will definitely get dizzy. Can't get out of the bed too fast or will fall back. Does not have much range of motion with her head, tends to keep eyes facing forwards.  Pt accompanied by: self  PERTINENT HISTORY: PMH: D deficiency, OSA, HTN, uterine fibroids, chronic low back pain, migraines  Per Dr. Loleta Chance on 08/24/23: Patient had a partial hysterectomy on 06/27/23 and 3 days later patient started noticing lightheadedness and dizziness. It would happen when she went to stand up. It has been gradually getting worse. She notices in the shower that she can  get lightheaded or dizzy (maybe with hot water). When she is in the sun, her vision can get hazy or blurry. She also gets dizzy there.    She had one fall when in the kitchen going to the counter and got dizzy and fell. Daily she gets dizzy and feels she may fall. She has to catch herself. When she changes positions or moving her head, she will likely be dizzy. If she moves to fast, she will feel lightheaded and weird feeling in her stomach. She will feel room spinning as well. If she holds still, it can resolve in seconds to minutes. When  she is still, she does not really have symptoms. She will also get dizzy when turning in bed, maybe more when turning to the right than the left.   She has associated headache, also progressively getting worse. These started weeks after her onset of dizziness though. She describes it as a pressure sensation in her forehead and temple areas. She endorses photophobia, phonophobia, nausea. She denies any worsening of headache with position change. She has a more remote history of migraines (about 20 years ago). The headache now is more severe. Her previous migraines were more in the back of her head. There was no previous photophobia or phonophobia. She currently takes advil or tylenol for her headache. She does not think they help. She is taking tylenol 1-2 per day of late.   PAIN:  Are you having pain? No  Vitals:   11/14/23 1123 11/14/23 1150  BP: 114/81 (!) 129/91  Pulse: 79 83   2nd BP taken after maneuver when pt reporting that she has a headache    PRECAUTIONS: Fall  WEIGHT BEARING RESTRICTIONS: No  FALLS: Has patient fallen in last 6 months? Yes. Number of falls 3, when the dizziness started. 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  DIAGNOSTIC FINDINGS: MRI brain 10/02/23: IMPRESSION: 1.  No evidence of an acute intracranial abnormality. 2. Subcentimeter focus within the mid right frontal lobe white matter favored to reflect a chronic lacunar infarct (rather than a prominent perivascular space). This corresponds with the finding described on the prior head CT of 08/09/2023. 3. Few tiny foci of T2 FLAIR  hyperintense signal abnormality elsewhere within the cerebral white matter. Findings are nonspecific and differential considerations include early/minimal chronic small vessel ischemic disease and sequelae of chronic migraine headaches, among others. 4. Partially empty sella turcica. This finding can reflect incidental anatomic variation, or alternatively, it can be associated with chronic idiopathic intracranial hypertension (pseudotumor cerebri). 5. Abnormal T1 hypointense marrow signal within the calvarium and imaged portions of the cervical spine. While this finding can reflect a marrow infiltrative process, the most common causes include chronic anemia, smoking and obesity.  COGNITION: Overall cognitive status: Within functional limits for tasks assessed   POSTURE:  rounded shoulders  Cervical ROM:    Decr cervical AROM, very guarded esp with rotation and extension. Pt afraid to move her head back   PATIENT SURVEYS:  FOTO DPS: 49, DFS: 37.9  VESTIBULAR ASSESSMENT:  GENERAL OBSERVATION: Ambulates in with no AD.  SYMPTOM BEHAVIOR:  Subjective history: See above.   Non-Vestibular symptoms: changes in hearing, changes in vision, neck pain, headaches, tinnitus, migraine symptoms, and reports R ear feels muffled, had to get a new prescription for her glasses. Reports was having migraine cluster headaches in the beginning, but now it is better with the nortriptyline    Type of dizziness: Imbalance (Disequilibrium), Spinning/Vertigo, Lightheadedness/Faint, and Wooziness  Frequency: Daily, just depends on what she is doing   Duration: 2 or 3 minutes at the most, nervous about changing position, so will just sit and wait   Aggravating factors: Induced by position change: rolling to the right, rolling to the left, supine to sit, and sit to stand and Induced by motion: bending down to the ground, turning body quickly, turning head quickly, and being in the shower   Relieving  factors:  Being still, has to sit in it   Progression of symptoms: better - because not moving as quick as she used to, now she really has to think of her movements   OCULOMOTOR EXAM:  Ocular Alignment: normal  Ocular ROM: No Limitations  Spontaneous Nystagmus: absent  Gaze-Induced Nystagmus: absent  Smooth Pursuits: intact and when going up and to the R, had a weird feeling  Saccades: intact and a little slow, feelings like eyes are getting stuck   VESTIBULAR - OCULAR REFLEX:   Slow VOR: Normal  VOR Cancellation: Normal "felt very awkward and chaotic"   Head-Impulse Test: HIT Right: negative HIT Left: positive, frequent blinking  Rating 3-4/10 dizziness afterwards   Dynamic Visual Acuity: Will assess at future session    POSITIONAL TESTING: Right Dix-Hallpike: no nystagmus Left Dix-Hallpike: upbeating, left nystagmus and lasting 5-8 seconds   MOTION SENSITIVITY:  Motion Sensitivity Quotient Intensity: 0 = none, 1 = Lightheaded, 2 = Mild, 3 = Moderate, 4 = Severe, 5 = Vomiting  Intensity  1. Sitting to supine   2. Supine to L side   3. Supine to R side   4. Supine to sitting   5. L Hallpike-Dix 5 (no vomiting)  6. Up from L  5 (no vomiting)  7. R Hallpike-Dix 2  8. Up from R  2  9. Sitting, head tipped to L knee   10. Head up from L knee   11. Sitting, head tipped to R knee   12. Head up from R knee   13. Sitting head turns x5   14.Sitting head nods x5   15. In stance, 180 turn to L    16. In stance, 180 turn to R       VESTIBULAR TREATMENT:                                                                                                   DATE: 11/14/23  Canalith Repositioning:  Epley Left: Number of Reps: 1, Response to Treatment: comment: did not get to re-assess at end of session due to time constraints , and Comment: ginger ale provided to pt afterwards due to nausea. Needed a prolonged seated rest break before pt felt ok to ambulate  out to waiting room     Supervision for gait when ambulating out of clinic due to pt feeling some imbalance   PATIENT EDUCATION: Education details: Clinical findings, POC, BPPV education, Purpose of treating with Epley maneuver  Person educated: Patient Education method: Explanation, Verbal cues, and Handouts Education comprehension: verbalized understanding  HOME EXERCISE PROGRAM:  Will provide at future session as needed   GOALS: Goals reviewed with patient? Yes  SHORT TERM GOALS: ALL STGS = LTGS  LONG TERM GOALS: Target date: 12/12/2023  Pt will be independent with final HEP for dizziness/vestibular deficits.  Baseline:  Goal status: INITIAL  2.  Pt will improve DPS to at least 58 in order to demo improved functional outcomes related to dizziness.   Baseline: 49 Goal status: INITIAL  3.  Pt will demo resolution of L posterior canal BPPV in order to decr dizziness with functional tasks/daily life.  Baseline: + L posterior canal BPPV Goal status: INITIAL  4.  DVA to be assessed with goal written.  Baseline:  Goal status: INITIAL  5.  mCTSIB to be assessed with goal written.  Baseline:  Goal status: INITIAL  ASSESSMENT:  CLINICAL IMPRESSION: Patient is a 47 year old female referred to Neuro OPPT for Vertigo.   Pt's PMH is significant for: OSA, HTN, uterine fibroids, chronic low back pain, migraines. The following deficits were present during the exam: guarded gait with limited head movement and decr cervical AROM due to fear of dizziness, positive HIT to the L indicating impaired VOR. Pt with L upbeating rotary nystagmus in L Dix-Hallpike lasting approx. 5-8 seconds, indicating L posterior canalithiasis. Pt reporting 5/5 dizziness on MSQ in L DixHallpike and with return to upright. Performed with 1 rep of the Epley maneuver with pt symptomatic afterwards and having a headache (BP WNL). Provided pt with gingerale and pt needed a prolonged seated rest break before being able to ambulate out to  waiting room.  Did not get to re-assess today due to time constraints. Pt would benefit from skilled PT to address these impairments and decr dizziness.    OBJECTIVE IMPAIRMENTS: decreased activity tolerance, decreased balance, difficulty walking, and dizziness.   ACTIVITY LIMITATIONS: carrying, lifting, bending, standing, squatting, transfers, bed mobility, reach over head, and locomotion level  PARTICIPATION LIMITATIONS: driving, community activity, and occupation  PERSONAL FACTORS: Behavior pattern, Past/current experiences, Time since onset of injury/illness/exacerbation, and 3+ comorbidities: OSA, HTN, uterine fibroids, chronic low back pain, migraines  are also affecting patient's functional outcome.   REHAB POTENTIAL: Good  CLINICAL DECISION MAKING: Evolving/moderate complexity  EVALUATION COMPLEXITY: Moderate   PLAN:  PT FREQUENCY: 2x/week  PT DURATION: 4 weeks - don't anticipate all 4 weeks   PLANNED INTERVENTIONS: 97164- PT Re-evaluation, 97110-Therapeutic exercises, 97530- Therapeutic activity, 97112- Neuromuscular re-education, 97535- Self Care, 96045- Manual therapy, 95992- Canalith repositioning, Balance training, and Vestibular training  PLAN FOR NEXT SESSION: Re-assess L posterior canal BPPV and treat as needed, pt symptomatic after tx the first time. If clear, can look at [facility] or mCTSIB and work on other vestibular deficits as needed    Drake Leach, PT, DPT 11/14/2023, 12:09 PM

## 2023-11-23 ENCOUNTER — Encounter: Payer: Self-pay | Admitting: Neurology

## 2023-11-23 ENCOUNTER — Other Ambulatory Visit: Payer: Self-pay | Admitting: Neurology

## 2023-11-23 ENCOUNTER — Other Ambulatory Visit (HOSPITAL_COMMUNITY): Payer: Self-pay

## 2023-11-23 ENCOUNTER — Other Ambulatory Visit (HOSPITAL_BASED_OUTPATIENT_CLINIC_OR_DEPARTMENT_OTHER): Payer: Self-pay

## 2023-11-23 ENCOUNTER — Ambulatory Visit: Payer: Commercial Managed Care - PPO | Attending: Internal Medicine

## 2023-11-23 VITALS — BP 124/76 | HR 90

## 2023-11-23 DIAGNOSIS — H8112 Benign paroxysmal vertigo, left ear: Secondary | ICD-10-CM | POA: Insufficient documentation

## 2023-11-23 DIAGNOSIS — R42 Dizziness and giddiness: Secondary | ICD-10-CM | POA: Diagnosis present

## 2023-11-23 DIAGNOSIS — R110 Nausea: Secondary | ICD-10-CM

## 2023-11-23 MED ORDER — ONDANSETRON 4 MG PO TBDP
4.0000 mg | ORAL_TABLET | Freq: Three times a day (TID) | ORAL | 1 refills | Status: AC | PRN
Start: 2023-11-23 — End: ?
  Filled 2023-11-23: qty 20, 7d supply, fill #0
  Filled 2024-10-09: qty 18, 6d supply, fill #1

## 2023-11-23 NOTE — Therapy (Signed)
OUTPATIENT PHYSICAL THERAPY VESTIBULAR TREATMENT     Patient Name: Deanna Mack MRN: [id number] DOB:1976-10-04, 47 y.o., female Today's Date: 11/23/2023  END OF SESSION:  PT End of Session - 11/23/23 1102     Visit Number 2    Number of Visits 5    Date for PT Re-Evaluation 12/14/23    Authorization Type [employer]    PT Start Time 1100    PT Stop Time 1150    PT Time Calculation (min) 50 min    Activity Tolerance Treatment limited secondary to medical complications (Comment)   dizziness   Behavior During Therapy [facility] for tasks assessed/performed;Flat affect             Past Medical History:  Diagnosis Date   COVID-19 09/2019   Family history of adverse reaction to anesthesia    Patient states her grandmother was difficult to arouse after anesthesia (when she was 47 years old.)   Hypertension    Follows w/ Dr. Renford Dills @ [facility].   Low back pain    during cycle only   Sleep apnea    Uterine fibroid    Wears glasses    Past Surgical History:  Procedure Laterality Date   DIAGNOSTIC LAPAROSCOPY  1996   LAPAROTOMY N/A 06/27/2023   Procedure: LAPAROTOMY;  Surgeon: Gerald Leitz, MD;  Location: [facility];  Service: Gynecology;  Laterality: N/A;   ROBOTIC ASSISTED LAPAROSCOPIC HYSTERECTOMY AND SALPINGECTOMY Bilateral 06/27/2023   Procedure: XI ROBOTIC ASSISTED LAPAROSCOPIC HYSTERECTOMY AND SALPINGECTOMY;  Surgeon: Gerald Leitz, MD;  Location: [facility];  Service: Gynecology;  Laterality: Bilateral;   TUBAL LIGATION  2000   Patient Active Problem List   Diagnosis Date Noted   Abnormal uterine bleeding 06/27/2023   S/P hysterectomy 06/27/2023    PCP: Renford Dills, MD  REFERRING PROVIDER: Antony Madura, MD  REFERRING DIAG: R42 (ICD-10-CM) - Vertigo M54.2 (ICD-10-CM) - Cervicalgia R42 (ICD-10-CM) - Dizziness   THERAPY DIAG:  Dizziness and giddiness  BPPV (benign paroxysmal positional vertigo),  left  ONSET DATE: 10/25/2023   Rationale for Evaluation and Treatment: Rehabilitation  SUBJECTIVE:   SUBJECTIVE STATEMENT: Patient arrives to clinic alone, denies driving here. Noticeably guarded cervical posture. Reports her "range of motion is better." Endorses dizziness still.   Pt accompanied by: self  PERTINENT HISTORY: PMH: D deficiency, OSA, HTN, uterine fibroids, chronic low back pain, migraines  Per Dr. Loleta Chance on 08/24/23: Patient had a partial hysterectomy on 06/27/23 and 3 days later patient started noticing lightheadedness and dizziness. It would happen when she went to stand up. It has been gradually getting worse. She notices in the shower that she can get lightheaded or dizzy (maybe with hot water). When she is in the sun, her vision can get hazy or blurry. She also gets dizzy there.    She had one fall when in the kitchen going to the counter and got dizzy and fell. Daily she gets dizzy and feels she may fall. She has to catch herself. When she changes positions or moving her head, she will likely be dizzy. If she moves to fast, she will feel lightheaded and weird feeling in her stomach. She will feel room spinning as well. If she holds still, it can resolve in seconds to minutes. When she is still, she does not really have symptoms. She will also get dizzy when turning in bed, maybe more when turning to the right than the left.   She has  associated headache, also progressively getting worse. These started weeks after her onset of dizziness though. She describes it as a pressure sensation in her forehead and temple areas. She endorses photophobia, phonophobia, nausea. She denies any worsening of headache with position change. She has a more remote history of migraines (about 20 years ago). The headache now is more severe. Her previous migraines were more in the back of her head. There was no previous photophobia or phonophobia. She currently takes advil or tylenol for her headache. She  does not think they help. She is taking tylenol 1-2 per day of late.   PAIN:  Are you having pain? No  Vitals:   11/23/23 1107 11/23/23 1115  BP: 124/80 124/76  Pulse: 90     2nd BP taken after maneuver when pt reporting that she has a headache    PRECAUTIONS: Fall  WEIGHT BEARING RESTRICTIONS: No  FALLS: Has patient fallen in last 6 months? Yes. Number of falls 3, when the dizziness started. 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  DIAGNOSTIC FINDINGS: MRI brain 10/02/23: IMPRESSION: 1.  No evidence of an acute intracranial abnormality. 2. Subcentimeter focus within the mid right frontal lobe white matter favored to reflect a chronic lacunar infarct (rather than a prominent perivascular space). This corresponds with the finding described on the prior head CT of 08/09/2023. 3. Few tiny foci of T2 FLAIR hyperintense signal abnormality elsewhere within the cerebral white matter. Findings are nonspecific and differential considerations include early/minimal chronic small vessel ischemic disease and sequelae of chronic migraine headaches, among others. 4. Partially empty sella turcica. This finding can reflect incidental anatomic variation, or alternatively, it can be associated with chronic idiopathic intracranial hypertension (pseudotumor cerebri). 5. Abnormal T1 hypointense marrow signal within the calvarium and imaged portions of the cervical spine. While this finding can reflect a marrow infiltrative process, the most common causes include chronic anemia, smoking and obesity.   VESTIBULAR  ASSESSMENT:   POSITIONAL TESTING: Left Dix-Hallpike: upbeating, left nystagmus and lasting 5-8 seconds with immediate onset    VESTIBULAR TREATMENT:                                                                                                   Canalith Repositioning:  Epley Left: Number of Reps: 1, Response to Treatment: comment: initially resolved, and Comment: upon sitting up, patient with onset of strong sympathetic response, BP remained stable  Used transport chair to escort patient to her wifes car  PATIENT EDUCATION: Education details: very basic habituation with cervical ROM Person educated: Patient Education method: Explanation, Verbal cues, and Handouts Education comprehension: verbalized understanding  HOME EXERCISE PROGRAM:  Will provide at future session as needed  GOALS: Goals reviewed with patient? Yes  SHORT TERM GOALS: ALL STGS = LTGS  LONG TERM GOALS: Target date: 12/12/2023  Pt will be independent with final HEP for dizziness/vestibular deficits.  Baseline:  Goal status: INITIAL  2.  Pt will improve DPS to at least 58 in order to demo improved functional outcomes related to dizziness.   Baseline: 49 Goal status: INITIAL  3.  Pt will demo resolution of L posterior canal BPPV in order to decr dizziness with functional tasks/daily life.  Baseline: + L posterior canal BPPV Goal status: INITIAL  4.  DVA to be assessed with goal written.  Baseline:  Goal status: INITIAL  5.  mCTSIB to be assessed with goal written.  Baseline:  Goal status: INITIAL  ASSESSMENT:  CLINICAL IMPRESSION: Patient seen for skilled PT session with emphasis on canalith repositioning. Patient with limited to poor tolerance to maneuvers, resulting in strong sympathetic response despite relatively mild case of BPPV (short duration and small amplitude nystagmus). Patient did require mat in trendelenburg for dix hallpike and Epley due to limited/guarded cervical extension.  Provided patient with alcohol swabs, ginger ale, cold towel and emesis bags. Discussed patients response to maneuver with wife. PT contacted MD regarding possibility of anti-emetic rx to allow for better tolerance to maneuvers. Continue POC as able.    OBJECTIVE IMPAIRMENTS: decreased activity tolerance, decreased balance, difficulty walking, and dizziness.   ACTIVITY LIMITATIONS: carrying, lifting, bending, standing, squatting, transfers, bed mobility, reach over head, and locomotion level  PARTICIPATION LIMITATIONS: driving, community activity, and occupation  PERSONAL FACTORS: Behavior pattern, Past/current experiences, Time since onset of injury/illness/exacerbation, and 3+ comorbidities: OSA, HTN, uterine fibroids, chronic low back pain, migraines  are also affecting patient's functional outcome.   REHAB POTENTIAL: Good  CLINICAL DECISION MAKING: Evolving/moderate complexity  EVALUATION COMPLEXITY: Moderate   PLAN:  PT FREQUENCY: 2x/week  PT DURATION: 4 weeks - don't anticipate all 4 weeks   PLANNED INTERVENTIONS: 97164- PT Re-evaluation, 97110-Therapeutic exercises, 97530- Therapeutic activity, 97112- Neuromuscular re-education, 97535- Self Care, 29518- Manual therapy, 95992- Canalith repositioning, Balance training, and Vestibular training  PLAN FOR NEXT SESSION: Re-assess L posterior canal BPPV and treat as needed, pt symptomatic after tx the first time. If clear, can look at [facility] or mCTSIB and work on other vestibular deficits as needed    Westley Foots, PT Westley Foots, PT, DPT, CBIS  11/23/2023, 12:18 PM

## 2023-11-24 ENCOUNTER — Other Ambulatory Visit (HOSPITAL_BASED_OUTPATIENT_CLINIC_OR_DEPARTMENT_OTHER): Payer: Self-pay

## 2023-11-24 ENCOUNTER — Other Ambulatory Visit (HOSPITAL_COMMUNITY): Payer: Self-pay

## 2023-11-26 ENCOUNTER — Other Ambulatory Visit (HOSPITAL_BASED_OUTPATIENT_CLINIC_OR_DEPARTMENT_OTHER): Payer: Self-pay

## 2023-11-26 ENCOUNTER — Other Ambulatory Visit: Payer: Self-pay

## 2023-11-27 ENCOUNTER — Telehealth: Payer: Self-pay | Admitting: Neurology

## 2023-11-27 NOTE — Telephone Encounter (Signed)
Called patient to discuss her MyChart message today: "Hi, Dr. Loleta Chance- I hope all is well.  I've had therapy at [facility] twice now.  The last time was December 5, and the first time was November 27.  It took me about 3 hours to recover from the first session with ongoing symptoms of vertigo and nausea.  The session from December 5, I'm having symptoms of vertigo and nausea medication, and the pressure behind my ears and constant lightheadedness.  When I initially left the session in a wheelchair, I was in a constant spin, the nausea was unbearable.    When I finally got out of the car to the front door I vomitted, and urinated on myself (so embarrassing) with the first few steps through the door and then vomitted again.  I can't ride in a car now without getting dizzy since I began this last session.  I  took the Zofran over the weekend and that has helped with the nausea.     At this point I don't want to go to therapy because it makes me so sick and weak.  As if I wasn't already dizzy and nauseous.  Oh I forgot to mention the weakness I experienced with each visit.  The last visit being the worst.  I had shakiness  and weakness.  Also I have been having more headaches.  Not migraines, but headaches behind my ears and top of my head.   I'm writing for information or confirmation that I'm in the right path, that this is normal through this process.  Mentally this is taking a toll on me, because [employer] for my long term disability keeps denying me.  And I can't afford to pay my bills, and I'm still not well enough to return to work"  Patient is not doing as well with current PT as she was with previous PT who was taking things slower with her. She had to switch due to very high co-pay. She is currently very dizzy, nauseated, and weak after current PT. She is having to hold off on therapy due to severity of symptoms. I understand this and find it reasonable. I did mention that I thought PT would be a good long  term strategy, maybe she just needs a break.  She has also not heard back from ENT, so I will look into this referral.  I also offered sooner follow up if needed. Patient will keep me up to date on symptoms.  All questions were answered.  Jacquelyne Balint, MD [facility] Neurology

## 2023-11-28 ENCOUNTER — Encounter: Payer: Commercial Managed Care - PPO | Admitting: Physical Therapy

## 2023-11-30 ENCOUNTER — Ambulatory Visit: Payer: Commercial Managed Care - PPO

## 2023-12-03 DIAGNOSIS — F4323 Adjustment disorder with mixed anxiety and depressed mood: Secondary | ICD-10-CM | POA: Diagnosis not present

## 2023-12-05 ENCOUNTER — Ambulatory Visit: Payer: Commercial Managed Care - PPO

## 2023-12-05 VITALS — BP 116/78 | HR 75

## 2023-12-05 DIAGNOSIS — R42 Dizziness and giddiness: Secondary | ICD-10-CM | POA: Diagnosis not present

## 2023-12-05 DIAGNOSIS — H8112 Benign paroxysmal vertigo, left ear: Secondary | ICD-10-CM

## 2023-12-05 NOTE — Therapy (Signed)
OUTPATIENT PHYSICAL THERAPY VESTIBULAR TREATMENT     Patient Name: Deanna Mack MRN: [id number] DOB:[birth year], 47 y.o., female Today's Date: 12/05/2023  END OF SESSION:  PT End of Session - 12/05/23 1103     Visit Number 3    Number of Visits 5    Date for PT Re-Evaluation 12/14/23    Authorization Type [employer]    PT Start Time 1101    PT Stop Time 1133    PT Time Calculation (min) 32 min    Activity Tolerance Treatment limited secondary to medical complications (Comment)   dizzy/nausea   Behavior During Therapy [facility] for tasks assessed/performed             Past Medical History:  Diagnosis Date   COVID-19 09/2019   Family history of adverse reaction to anesthesia    Patient states her grandmother was difficult to arouse after anesthesia (when she was 47 years old.)   Hypertension    Follows w/ Dr. Renford Dills @ [facility].   Low back pain    during cycle only   Sleep apnea    Uterine fibroid    Wears glasses    Past Surgical History:  Procedure Laterality Date   DIAGNOSTIC LAPAROSCOPY  1996   LAPAROTOMY N/A 06/27/2023   Procedure: LAPAROTOMY;  Surgeon: Gerald Leitz, MD;  Location: [facility];  Service: Gynecology;  Laterality: N/A;   ROBOTIC ASSISTED LAPAROSCOPIC HYSTERECTOMY AND SALPINGECTOMY Bilateral 06/27/2023   Procedure: XI ROBOTIC ASSISTED LAPAROSCOPIC HYSTERECTOMY AND SALPINGECTOMY;  Surgeon: Gerald Leitz, MD;  Location: [facility];  Service: Gynecology;  Laterality: Bilateral;   TUBAL LIGATION  2000   Patient Active Problem List   Diagnosis Date Noted   Abnormal uterine bleeding 06/27/2023   S/P hysterectomy 06/27/2023    PCP: Renford Dills, MD  REFERRING PROVIDER: Antony Madura, MD  REFERRING DIAG: R42 (ICD-10-CM) - Vertigo M54.2 (ICD-10-CM) - Cervicalgia R42 (ICD-10-CM) - Dizziness   THERAPY DIAG:  Dizziness and giddiness  BPPV (benign paroxysmal positional vertigo),  left  ONSET DATE: 10/25/2023   Rationale for Evaluation and Treatment: Rehabilitation  SUBJECTIVE:   SUBJECTIVE STATEMENT: Patient reports not doing well. Dizziness is the same, feels weak. Now reporting photosensitivity resulting in dizziness. Zofran seems to have helped a little with the nausea. Denies falls. Reports she felt as though her "brain was detached" during last session and has "achy ears" now.   Pt accompanied by: self, wife drove her- waiting outside.   PERTINENT HISTORY: PMH: D deficiency, OSA, HTN, uterine fibroids, chronic low back pain, migraines  Per Dr. Loleta Chance on 08/24/23: Patient had a partial hysterectomy on 06/27/23 and 3 days later patient started noticing lightheadedness and dizziness. It would happen when she went to stand up. It has been gradually getting worse. She notices in the shower that she can get lightheaded or dizzy (maybe with hot water). When she is in the sun, her vision can get hazy or blurry. She also gets dizzy there.    She had one fall when in the kitchen going to the counter and got dizzy and fell. Daily she gets dizzy and feels she may fall. She has to catch herself. When she changes positions or moving her head, she will likely be dizzy. If she moves to fast, she will feel lightheaded and weird feeling in her stomach. She will feel room spinning as well. If she holds still, it can resolve in seconds to minutes. When she is still,  she does not really have symptoms. She will also get dizzy when turning in bed, maybe more when turning to the right than the left.   She has associated headache, also progressively getting worse. These started weeks after her onset of dizziness though. She describes it as a pressure sensation in her forehead and temple areas. She endorses photophobia, phonophobia, nausea. She denies any worsening of headache with position change. She has a more remote history of migraines (about 20 years ago). The headache now is more severe.  Her previous migraines were more in the back of her head. There was no previous photophobia or phonophobia. She currently takes advil or tylenol for her headache. She does not think they help. She is taking tylenol 1-2 per day of late.   PAIN:  Are you having pain? No  Vitals:   12/05/23 1120 12/05/23 1129  BP: (!) 116/92 116/78  Pulse: 84 75     2nd BP taken after maneuver when pt reporting that she has a headache    PRECAUTIONS: Fall    PATIENT GOALS: Wants to feel a little better   OBJECTIVE:  Note: Objective measures were completed at Evaluation unless otherwise noted.  DIAGNOSTIC FINDINGS: MRI brain 10/02/23: IMPRESSION: 1.  No evidence of an acute intracranial abnormality. 2. Subcentimeter focus within the mid right frontal lobe white matter favored to reflect a chronic lacunar infarct (rather than a prominent perivascular space). This corresponds with the finding described on the prior head CT of 08/09/2023. 3. Few tiny foci of T2 FLAIR hyperintense signal abnormality elsewhere within the cerebral white matter. Findings are nonspecific and differential considerations include early/minimal chronic small vessel ischemic disease and sequelae of chronic migraine headaches, among others. 4. Partially empty sella turcica. This finding can reflect incidental anatomic variation, or alternatively, it can be associated with chronic idiopathic intracranial hypertension (pseudotumor cerebri). 5. Abnormal T1 hypointense marrow signal within the calvarium and imaged portions of the cervical spine. While this finding can reflect a marrow infiltrative process, the most common causes include chronic anemia, smoking and obesity.   VESTIBULAR ASSESSMENT:   POSITIONAL TESTING: Left Dix-Hallpike: upbeating, left nystagmus and lasting 5-8 seconds with immediate onset    VESTIBULAR TREATMENT:                                                                                                    Canalith Repositioning:  Epley Left: Number of Reps: 1, Response to Treatment: symptoms improved, and Comment: upon sitting up, patient with onset of sympathetic response (improved since last time), BP remained stable  Escorted patient to outside lobby where wife got her  PATIENT EDUCATION: Education details: very basic habituation with cervical ROM, PT POC Person educated: Patient Education method: Explanation, Verbal cues, and Handouts Education comprehension: verbalized understanding  HOME EXERCISE PROGRAM:  Will provide at future session as needed   GOALS: Goals reviewed with patient? Yes  SHORT TERM GOALS: ALL STGS = LTGS  LONG TERM GOALS: Target date: 12/12/2023  Pt will be independent with final HEP for dizziness/vestibular deficits.  Baseline:  Goal status: INITIAL  2.  Pt will improve DPS to at least 58 in order to demo improved functional outcomes related to dizziness.   Baseline: 49 Goal status: INITIAL  3.  Pt will demo resolution of L posterior canal BPPV in order to decr dizziness with functional tasks/daily life.  Baseline: + L posterior canal BPPV Goal status: INITIAL  4.  DVA to be assessed with goal written.  Baseline:  Goal status: INITIAL  5.  mCTSIB to be assessed with goal written.  Baseline:  Goal status: INITIAL  ASSESSMENT:  CLINICAL IMPRESSION: Patient seen for skilled PT session with emphasis on canalith repositioning. Discussed beforehand that we would complete 1 dix hallpike and 1 epley maneuver and not re-check so as to not make patient as ill as last session. Patient verbalized understanding. Remains with L posterior canal canalithiasis with immediate onset brisk L upbeating torsional nystagmus. Tolerated Epley much better this session. Continue POC as able.    OBJECTIVE IMPAIRMENTS: decreased activity tolerance, decreased balance, difficulty walking, and dizziness.   ACTIVITY LIMITATIONS: carrying, lifting, bending, standing,  squatting, transfers, bed mobility, reach over head, and locomotion level  PARTICIPATION LIMITATIONS: driving, community activity, and occupation  PERSONAL FACTORS: Behavior pattern, Past/current experiences, Time since onset of injury/illness/exacerbation, and 3+ comorbidities: OSA, HTN, uterine fibroids, chronic low back pain, migraines  are also affecting patient's functional outcome.   REHAB POTENTIAL: Good  CLINICAL DECISION MAKING: Evolving/moderate complexity  EVALUATION COMPLEXITY: Moderate   PLAN:  PT FREQUENCY: 2x/week  PT DURATION: 4 weeks - don't anticipate all 4 weeks   PLANNED INTERVENTIONS: 97164- PT Re-evaluation, 97110-Therapeutic exercises, 97530- Therapeutic activity, 97112- Neuromuscular re-education, 97535- Self Care, 52841- Manual therapy, 95992- Canalith repositioning, Balance training, and Vestibular training  PLAN FOR NEXT SESSION: Re-assess L posterior canal BPPV and treat as needed, pt symptomatic after tx the first time. If clear, can look at [facility] or mCTSIB and work on other vestibular deficits as needed    Westley Foots, PT Westley Foots, PT, DPT, CBIS  12/05/2023, 11:40 AM

## 2023-12-07 ENCOUNTER — Ambulatory Visit: Payer: Commercial Managed Care - PPO

## 2023-12-10 ENCOUNTER — Ambulatory Visit: Payer: Commercial Managed Care - PPO

## 2023-12-10 DIAGNOSIS — R42 Dizziness and giddiness: Secondary | ICD-10-CM

## 2023-12-10 DIAGNOSIS — H8112 Benign paroxysmal vertigo, left ear: Secondary | ICD-10-CM

## 2023-12-10 NOTE — Therapy (Signed)
OUTPATIENT PHYSICAL THERAPY VESTIBULAR TREATMENT     Patient Name: Deanna Mack MRN: [id number] DOB:Mar 27, 1976, 47 y.o., female Today's Date: 12/10/2023  END OF SESSION:  PT End of Session - 12/10/23 1115     Visit Number 4    Number of Visits 5    Date for PT Re-Evaluation 12/14/23    Authorization Type [employer]    PT Start Time 1114   patient late   PT Stop Time 1133   BPPV tx   PT Time Calculation (min) 19 min    Activity Tolerance Treatment limited secondary to medical complications (Comment)   nausea   Behavior During Therapy [facility] for tasks assessed/performed             Past Medical History:  Diagnosis Date   COVID-19 09/2019   Family history of adverse reaction to anesthesia    Patient states her grandmother was difficult to arouse after anesthesia (when she was 47 years old.)   Hypertension    Follows w/ Dr. Renford Dills @ [facility].   Low back pain    during cycle only   Sleep apnea    Uterine fibroid    Wears glasses    Past Surgical History:  Procedure Laterality Date   DIAGNOSTIC LAPAROSCOPY  1996   LAPAROTOMY N/A 06/27/2023   Procedure: LAPAROTOMY;  Surgeon: Gerald Leitz, MD;  Location: [facility];  Service: Gynecology;  Laterality: N/A;   ROBOTIC ASSISTED LAPAROSCOPIC HYSTERECTOMY AND SALPINGECTOMY Bilateral 06/27/2023   Procedure: XI ROBOTIC ASSISTED LAPAROSCOPIC HYSTERECTOMY AND SALPINGECTOMY;  Surgeon: Gerald Leitz, MD;  Location: [facility];  Service: Gynecology;  Laterality: Bilateral;   TUBAL LIGATION  2000   Patient Active Problem List   Diagnosis Date Noted   Abnormal uterine bleeding 06/27/2023   S/P hysterectomy 06/27/2023    PCP: Renford Dills, MD  REFERRING PROVIDER: Antony Madura, MD  REFERRING DIAG: R42 (ICD-10-CM) - Vertigo M54.2 (ICD-10-CM) - Cervicalgia R42 (ICD-10-CM) - Dizziness   THERAPY DIAG:  Dizziness and giddiness  BPPV (benign paroxysmal positional  vertigo), left  ONSET DATE: 10/25/2023   Rationale for Evaluation and Treatment: Rehabilitation  SUBJECTIVE:   SUBJECTIVE STATEMENT: Patient reports doing slightly better. Is nauseous this morning, but took Zofran 1 hour ago. Still dizzy. Denies falls. Tolerated last tx much better than previous.   Pt accompanied by: self, wife drove her- waiting outside.   PERTINENT HISTORY: PMH: D deficiency, OSA, HTN, uterine fibroids, chronic low back pain, migraines  Per Dr. Loleta Chance on 08/24/23: Patient had a partial hysterectomy on 06/27/23 and 3 days later patient started noticing lightheadedness and dizziness. It would happen when she went to stand up. It has been gradually getting worse. She notices in the shower that she can get lightheaded or dizzy (maybe with hot water). When she is in the sun, her vision can get hazy or blurry. She also gets dizzy there.    She had one fall when in the kitchen going to the counter and got dizzy and fell. Daily she gets dizzy and feels she may fall. She has to catch herself. When she changes positions or moving her head, she will likely be dizzy. If she moves to fast, she will feel lightheaded and weird feeling in her stomach. She will feel room spinning as well. If she holds still, it can resolve in seconds to minutes. When she is still, she does not really have symptoms. She will also get dizzy when turning in  bed, maybe more when turning to the right than the left.   She has associated headache, also progressively getting worse. These started weeks after her onset of dizziness though. She describes it as a pressure sensation in her forehead and temple areas. She endorses photophobia, phonophobia, nausea. She denies any worsening of headache with position change. She has a more remote history of migraines (about 20 years ago). The headache now is more severe. Her previous migraines were more in the back of her head. There was no previous photophobia or phonophobia. She  currently takes advil or tylenol for her headache. She does not think they help. She is taking tylenol 1-2 per day of late.   PAIN:  Are you having pain? No   PRECAUTIONS: Fall  PATIENT GOALS: Wants to feel a little better   OBJECTIVE:  Note: Objective measures were completed at Evaluation unless otherwise noted.  DIAGNOSTIC FINDINGS: MRI brain 10/02/23: IMPRESSION: 1.  No evidence of an acute intracranial abnormality. 2. Subcentimeter focus within the mid right frontal lobe white matter favored to reflect a chronic lacunar infarct (rather than a prominent perivascular space). This corresponds with the finding described on the prior head CT of 08/09/2023. 3. Few tiny foci of T2 FLAIR hyperintense signal abnormality elsewhere within the cerebral white matter. Findings are nonspecific and differential considerations include early/minimal chronic small vessel ischemic disease and sequelae of chronic migraine headaches, among others. 4. Partially empty sella turcica. This finding can reflect incidental anatomic variation, or alternatively, it can be associated with chronic idiopathic intracranial hypertension (pseudotumor cerebri). 5. Abnormal T1 hypointense marrow signal within the calvarium and imaged portions of the cervical spine. While this finding can reflect a marrow infiltrative process, the most common causes include chronic anemia, smoking and obesity.   VESTIBULAR ASSESSMENT:   POSITIONAL TESTING: Left Dix-Hallpike: upbeating, left nystagmus and lasting 5-8 seconds with immediate onset    VESTIBULAR TREATMENT:                                                                                                   Canalith Repositioning:  Epley Left: Number of Reps: 1, Response to Treatment: symptoms improved, and Comment: upon sitting up, patient with onset of sympathetic response (improved since last time)  Escorted patient to outside lobby where wife got her  PATIENT  EDUCATION: Education details: continue basic HEP Person educated: Patient Education method: Explanation, Verbal cues, and Handouts Education comprehension: verbalized understanding  HOME EXERCISE PROGRAM:  Will provide at future session as needed   GOALS: Goals reviewed with patient? Yes  SHORT TERM GOALS: ALL STGS = LTGS  LONG TERM GOALS: Target date: 12/12/2023  Pt will be independent with final HEP for dizziness/vestibular deficits.  Baseline:  Goal status: INITIAL  2.  Pt will improve DPS to at least 58 in order to demo improved functional outcomes related to dizziness.   Baseline: 49 Goal status: INITIAL  3.  Pt will demo resolution of L posterior canal BPPV in order to decr dizziness with functional tasks/daily life.  Baseline: + L posterior canal BPPV Goal status: INITIAL  4.  DVA to be assessed with goal written.  Baseline:  Goal status: INITIAL  5.  mCTSIB to be assessed with goal written.  Baseline:  Goal status: INITIAL  ASSESSMENT:  CLINICAL IMPRESSION: Patient seen for skilled PT session with emphasis on BPPV tx. Continues to present with L posterior canal canalithiasis. Patient also continues to have very poor tolerance to Epley maneuver with sympathetic response, though that is beginning to decrease. Expect very slow, if any, progress with patient only tolerating 1-2 Epley maneuvers/ week. PT hesitant to prescribe brandt daroff to supplement at home since patient has a very limited self-selected cervical ROM and has increased risk of repositioning otoliths into additional canals. Continue POC as able.    OBJECTIVE IMPAIRMENTS: decreased activity tolerance, decreased balance, difficulty walking, and dizziness.   ACTIVITY LIMITATIONS: carrying, lifting, bending, standing, squatting, transfers, bed mobility, reach over head, and locomotion level  PARTICIPATION LIMITATIONS: driving, community activity, and occupation  PERSONAL FACTORS: Behavior pattern,  Past/current experiences, Time since onset of injury/illness/exacerbation, and 3+ comorbidities: OSA, HTN, uterine fibroids, chronic low back pain, migraines  are also affecting patient's functional outcome.   REHAB POTENTIAL: Good  CLINICAL DECISION MAKING: Evolving/moderate complexity  EVALUATION COMPLEXITY: Moderate   PLAN:  PT FREQUENCY: 2x/week  PT DURATION: 4 weeks - don't anticipate all 4 weeks   PLANNED INTERVENTIONS: 97164- PT Re-evaluation, 97110-Therapeutic exercises, 97530- Therapeutic activity, 97112- Neuromuscular re-education, 97535- Self Care, 16109- Manual therapy, 95992- Canalith repositioning, Balance training, and Vestibular training  PLAN FOR NEXT SESSION: Re-assess L posterior canal BPPV and treat as needed, pt symptomatic after tx the first time. If clear, can look at [facility] or mCTSIB and work on other vestibular deficits as needed    Westley Foots, PT Westley Foots, PT, DPT, CBIS  12/10/2023, 12:35 PM

## 2023-12-13 ENCOUNTER — Ambulatory Visit: Payer: Commercial Managed Care - PPO

## 2023-12-14 ENCOUNTER — Encounter: Payer: Self-pay | Admitting: Neurology

## 2023-12-18 ENCOUNTER — Encounter: Payer: Self-pay | Admitting: Physical Therapy

## 2023-12-18 DIAGNOSIS — F4323 Adjustment disorder with mixed anxiety and depressed mood: Secondary | ICD-10-CM | POA: Diagnosis not present

## 2023-12-25 ENCOUNTER — Other Ambulatory Visit (HOSPITAL_COMMUNITY): Payer: Self-pay

## 2024-01-18 ENCOUNTER — Other Ambulatory Visit (HOSPITAL_BASED_OUTPATIENT_CLINIC_OR_DEPARTMENT_OTHER): Payer: Self-pay

## 2024-01-23 NOTE — Progress Notes (Signed)
NEUROLOGY FOLLOW UP OFFICE NOTE  Deanna Mack [id number]  Subjective:  Deanna Mack is a 48 y.o. year old right-handed female with a medical history of vit D deficiency, OSA, HTN, uterine fibroids, chronic low back pain, migraines who we last saw on 10/12/23 for dizziness and headaches.  To briefly review: 08/24/23: Patient had a partial hysterectomy on 06/27/23 and 3 days later patient started noticing lightheadedness and dizziness. It would happen when she went to stand up. It has been gradually getting worse. She notices in the shower that she can get lightheaded or dizzy (maybe with hot water). When she is in the sun, her vision can get hazy or blurry. She also gets dizzy there.    She had one fall when in the kitchen going to the counter and got dizzy and fell. Daily she gets dizzy and feels she may fall. She has to catch herself. When she changes positions or moving her head, she will likely be dizzy. If she moves to fast, she will feel lightheaded and weird feeling in her stomach. She will feel room spinning as well. If she holds still, it can resolve in seconds to minutes. When she is still, she does not really have symptoms. She will also get dizzy when turning in bed, maybe more when turning to the right than the left.   She has associated headache, also progressively getting worse. These started weeks after her onset of dizziness though. She describes it as a pressure sensation in her forehead and temple areas. She endorses photophobia, phonophobia, nausea. She denies any worsening of headache with position change. She has a more remote history of migraines (about 20 years ago). The headache now is more severe. Her previous migraines were more in the back of her head. There was no previous photophobia or phonophobia. She currently takes advil or tylenol for her headache. She does not think they help. She is taking tylenol 1-2 per day of late.    She also endorses  neck pain.   She has been prescribed meclizine 25 mg q8h PRN. She does not think this has really helped.   Her grandmother had migraines. She also had a cousin with a brain tumor.   EtOH use: none Restrictive diet: no  Patient started nortriptyline 20 mg at bedtime on 08/24/23.  10/12/23: Regarding her vertigo, she still has it intermittently. She tends to get it more when changing positions. She also mentions getting lightheaded when she reaches up to grab something above her head.   She feels her hearing is muffled, as if she is in a pool. She is not sure which ear this was in. She also endorses tinnitus in her right ear. She also endorses significant anxiety that has worsened of late with chest pain and a lot of emotions since her symptoms. She is not sure if it is all related to her medical condition or if it is more generalized.   Her headaches have improved a little. She still has extreme light sensitivity. She will get a headache (tightness in the head) when she goes out into the sun. She feels like laying down is okay for her head, but standing up makes them worse. She will feel increased pressure in her head but then go away. Her average headache is about 20-30 minutes. She is taking Nortriptyline 20 mg daily. She does not think she has clear side effects, but maybe the anxiety got worse after starting the medication.   She is  still having headaches most days. She has to take tylenol for more severe headache 1-2 times per week. The headache will improve with tylenol and laying down. She also have have occasional neck pain.   She endorses some blurry vision and occasional double vision. She denies any vision loss. She saw [facility] one week ago. She had some changes to her vision, but states they did not see any increased pressure on her eye (presumably no papilledema).    She has never seen ENT. She has not done any therapy.   Labs were normal.   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 MRI brain with no abnormalities to explain her symptoms. I am most concerned for inner ear pathology, particularly with hearing changes and tinnitus. She will benefit from ENT referral and consideration of vestibular testing. Headaches - associated with photophobia, phonophobia, nausea, and cervicalgia. If anything, her headaches are worse when standing, not worse laying down. There is also no evidence of papilledema. MRI showing partially empty sella may be normal finding, thought IIH is still a consideration. Patient thinks she is improving with nortriptyline though. How or if headache is related to vertigo is currently unclear.   Plan: -Discussed LP - will hold off for now -Will request records from [facility] -Referral to ENT -Referral to vestibular Rehab   For presumed migraines: Migraine prevention:  Patient prefers to continue nortriptyline for headache prevention. Will increase to 30 mg daily. Migraine rescue:  Tylenol as needed Limit use of pain relievers to no more than 2 days out of week to prevent risk of rebound or medication-overuse headache. Keep headache diary  Since their last visit: Per my phone note from 11/27/23: Called patient to discuss her MyChart message today: "Hi, Dr. Loleta Chance- I hope all is well.  I've had therapy at [facility] twice now.  The last time was December 5, and the first time was November 27.  It took me about 3 hours to recover from the first session with ongoing symptoms of vertigo and nausea.  The session from December 5, I'm having symptoms of vertigo and nausea medication, and the pressure behind my ears and constant lightheadedness.  When I initially left the session in a wheelchair, I was in a  constant spin, the nausea was unbearable.    When I finally got out of the car to the front door I vomitted, and urinated on myself (so embarrassing) with the first few steps through the door and then vomitted again.  I can't ride in a car now without getting dizzy since I began this last session.  I  took the Zofran over the weekend and that has helped with the nausea.     At this point I don't want to go to therapy because it makes me so sick and weak.  As if I wasn't already dizzy and nauseous.  Oh I forgot to mention the weakness I experienced with each visit.  The last visit being the worst.  I had shakiness  and weakness.  Also I have been having more headaches.  Not migraines, but headaches behind my ears and top of my head.   I'm writing for information or confirmation that I'm in the right path, that this is normal through this  process.  Mentally this is taking a toll on me, because [employer] for my long term disability keeps denying me.  And I can't afford to pay my bills, and I'm still not well enough to return to work"   Patient is not doing as well with current PT as she was with previous PT who was taking things slower with her. She had to switch due to very high co-pay. She is currently very dizzy, nauseated, and weak after current PT. She is having to hold off on therapy due to severity of symptoms. I understand this and find it reasonable. I did mention that I thought PT would be a good long term strategy, maybe she just needs a break.   She has also not heard back from ENT, so I will look into this referral.   I also offered sooner follow up if needed. Patient will keep me up to date on symptoms.  Patient did see ENT whose ddx included BBPV, vestibular migraine. Vestibular testing was normal per reports from [facility] phone note. She went to vestibular rehab that did help some. She did not go after our talk on 11/27/23.  Overall, she feels maybe a little better. She feels like she can  easily get dizzy when turning her head. She gets very dizzy in the car. She feels her hearing is muffled. She has some blurry vision, but things are better with glasses. She gets short of breath occasionally and will feel almost a panic like attack.  She continues to get headaches. She will have a headache 1-2 times of week. Anxiety or stress is a primary trigger. She has been taking nortriptyline 30 mg daily. She has more pressure in her head in the morning. It gets better as the day goes on.  She lost her job at [employer]. She is currently getting benefits from [employer].  MEDICATIONS:  Outpatient Encounter Medications as of 02/01/2024  Medication Sig   acetaminophen (TYLENOL) 500 MG tablet Take 2 tablets (1,000 mg total) by mouth every 8 (eight) hours as needed for mild pain or moderate pain.   cyclobenzaprine (FLEXERIL) 10 MG tablet Take 1 tablet (10 mg) by mouth every 8 hours as needed for muscle spasms   ibuprofen (ADVIL) 600 MG tablet Take 1 tablet (600 mg total) by mouth every 6 (six) hours as needed.   ondansetron (ZOFRAN-ODT) 4 MG disintegrating tablet Take 1 tablet (4 mg total) by mouth every 8 (eight) hours as needed.   spironolactone (ALDACTONE) 50 MG tablet Take 1 tablet (50 mg total) by mouth daily.   [DISCONTINUED] nortriptyline (PAMELOR) 10 MG capsule Take 3 capsules (30 mg total) by mouth at bedtime.   cyclobenzaprine (FLEXERIL) 10 MG tablet Take 1 tablet by mouth up to every 8 hours (at bedtime) as needed (Patient not taking: Reported on 02/01/2024)   meclizine (ANTIVERT) 25 MG tablet Take 1 tablet (25 mg total) by mouth every 8 (eight) hours as needed for 10 days. (Patient not taking: Reported on 02/01/2024)   nortriptyline (PAMELOR) 50 MG capsule Take 1 capsule (50 mg total) by mouth at bedtime.   oxyCODONE (OXY IR/ROXICODONE) 5 MG immediate release tablet Take 1 tablet (5 mg total) by mouth every 4 (four) hours as needed for moderate pain. (Patient not taking: Reported on 02/01/2024)    [DISCONTINUED] norethindrone (AYGESTIN) 5 MG tablet Take 2 tablets (10 mg total) by mouth daily.   No facility-administered encounter medications on file as of 02/01/2024.    PAST MEDICAL HISTORY:  Past Medical History:  Diagnosis Date   COVID-19 09/2019   Family history of adverse reaction to anesthesia    Patient states her grandmother was difficult to arouse after anesthesia (when she was 48 years old.)   Hypertension    Follows w/ Dr. Renford Dills @ [facility].   Low back pain    during cycle only   Sleep apnea    Uterine fibroid    Wears glasses     PAST SURGICAL HISTORY: Past Surgical History:  Procedure Laterality Date   DIAGNOSTIC LAPAROSCOPY  1996   LAPAROTOMY N/A 06/27/2023   Procedure: LAPAROTOMY;  Surgeon: Gerald Leitz, MD;  Location: [facility];  Service: Gynecology;  Laterality: N/A;   ROBOTIC ASSISTED LAPAROSCOPIC HYSTERECTOMY AND SALPINGECTOMY Bilateral 06/27/2023   Procedure: XI ROBOTIC ASSISTED LAPAROSCOPIC HYSTERECTOMY AND SALPINGECTOMY;  Surgeon: Gerald Leitz, MD;  Location: [facility];  Service: Gynecology;  Laterality: Bilateral;   TUBAL LIGATION  2000    ALLERGIES: Allergies  Allergen Reactions   Gabapentin Other (See Comments)    Dizziness and lightheadedness.   Hydrochlorothiazide Swelling    FAMILY HISTORY: Family History  Problem Relation Age of Onset   High blood pressure Father    COPD Father    Heart Problems Father    Breast cancer Paternal Grandmother     SOCIAL HISTORY: Social History   Tobacco Use   Smoking status: Never   Smokeless tobacco: Never  Vaping Use   Vaping status: Never Used  Substance Use Topics   Alcohol use: Not Currently   Drug use: Not Currently   Social History   Social History Narrative   Are you right handed or left handed? Right   Are you currently employed ? yes   What is your current occupation? [employer]   Do you live at home alone?no   Who lives with you? wife    What type of home do you live in: 1 story or 2 story? two    Caffeine 2 - 4 cups a week       Objective:  Vital Signs:  BP 120/79   Pulse (!) 113   Ht 5\' 7"  (1.702 m)   Wt 249 lb (112.9 kg)   SpO2 99%   BMI 39.00 kg/m   General: No acute distress.  Patient appears well-groomed.   Head:  Normocephalic/atraumatic Eyes:  fundi examined, disc margins clear, no obvious papilledema Neck: supple Heart: Tachycardic Lungs: Clear to auscultation bilaterally. Vascular: No carotid bruits.  Neurological Exam: Mental status: alert and oriented, speech fluent and not dysarthric, language intact.  Cranial nerves: CN I: not tested CN II: pupils equal, round and reactive to light, visual fields intact CN III, IV, VI:  full range of motion, no nystagmus, no ptosis CN V: facial sensation intact. CN VII: upper and lower face symmetric CN VIII: hearing intact CN IX, X: uvula midline CN XI: sternocleidomastoid and trapezius muscles intact CN XII: tongue midline  Head impulse testing with poor fixation  Bulk & Tone: normal, no fasciculations. Motor:  muscle strength 5/5 throughout Deep Tendon Reflexes:  2+ throughout.   Sensation:  Pinprick, vibratory, and proprioceptive sensation intact. Finger to nose testing:  Without dysmetria.   Heel to shin:  Without dysmetria.   Gait:  Normal station and stride.  Romberg negative.   Labs and Imaging review: New results: Audiologic/vestibular testing (01/08/24 - external): Otoscopy: Otoscopic examination showed canals free of obstructive cerumen, bilaterally.  Tympanometry: Tympanometry was completed  due to reported history. Results were: Right Ear: Type A - Normal compliance, middle ear pressure, and ear canal volume. Left Ear: Type A - Normal compliance, middle ear pressure, and ear canal volume.  Audiometric Results: Results were obtained using headphones. Test reliability was good.  Test was completed in Sound [place] 1 and printed out  using [employer] Suite. The Sound [place] was last calibrated on September 10, 2023.  Puretone Thresholds: Right ear results show normal hearing for the frequencies [phone] Hz.  Left ear results show normal hearing for the frequencies [phone] Hz.   Results are symmetric.  Speech Reception Thresholds: Completed using monitored live voice. Right Ear: 15 dBHL; which is in agreement with PTA.  Left Ear: 10 dBHL; which is in agreement with PTA.   Word Recognition Testing: Completed using a recording of [id number]. Right Ear: 88 % at 55 dBHL with 35 dBem, which is a normal listening level. Left Ear: 92 % at 55 dBHL with 35 dBem, which is a normal listening level.  Impression: Normal hearing with normal middle ear function, bilaterally.   Previously reviewed results: 08/24/23: B1 wnl B12: 496 Folate wnl   External labs: TSH (07/24/23) wnl BMP (07/24/23) unremarkable CBC (07/24/23) unremarkable Lipid panel (07/24/23): total cholesterol 184, TG 122, LDL 109 Ferritin (09/04/22): 29.9    CT head wo contrast (08/09/23): FINDINGS: Brain: No acute territorial infarction, hemorrhage, or intracranial mass. Age indeterminate small hypodensity within the right white Mater but some artifact limits assessment, series 2, image 17. Nonenlarged ventricles   Vascular: No hyperdense vessels.  No unexpected calcification   Skull: Normal. Negative for fracture or focal lesion.   Sinuses/Orbits: No acute finding.   Other: None   IMPRESSION: 1. Negative for hemorrhage or intracranial mass. 2. Age indeterminate small hypodensity within the right white matter but some artifact limits assessment. This could represent mild age indeterminate small vessel ischemic change.  MRI brain w/wo contrast (09/30/23): FINDINGS: Brain:   Cerebral volume is normal.   Subcentimeter T2 hyperintense focus within the mid right frontal lobe white matter with mild surrounding gliosis (series 111, image 25) (series 112, image  19).   There are a few tiny nonspecific foci of T2 FLAIR hyperintense signal abnormality scattered elsewhere within the bilateral cerebral white matter.   Partially empty sella turcica.   There is no acute infarct.   No evidence of an intracranial mass.   No extra-axial fluid collection.   No midline shift.   No pathologic intracranial enhancement identified.   Vascular: Maintained flow voids within the proximal large arterial vessels.   Skull and upper cervical spine: Abnormal T1 hypointense marrow signal within the calvarium and imaged portions of the cervical spine.   Sinuses/Orbits: No mass or acute finding within the imaged orbits. No significant paranasal sinus disease.   IMPRESSION: 1.  No evidence of an acute intracranial abnormality. 2. Subcentimeter focus within the mid right frontal lobe white matter favored to reflect a chronic lacunar infarct (rather than a prominent perivascular space). This corresponds with the finding described on the prior head CT of 08/09/2023. 3. Few tiny foci of T2 FLAIR hyperintense signal abnormality elsewhere within the cerebral white matter. Findings are nonspecific and differential considerations include early/minimal chronic small vessel ischemic disease and sequelae of chronic migraine headaches, among others. 4. Partially empty sella turcica. This finding can reflect incidental anatomic variation, or alternatively, it can be associated with chronic idiopathic intracranial hypertension (pseudotumor cerebri). 5. Abnormal T1 hypointense marrow signal within the calvarium  and imaged portions of the cervical spine. While this finding can reflect a marrow infiltrative process, the most common causes include chronic anemia, smoking and obesity.  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 They occur with position change or turning  of head and per patient report are very disabling, even preventing her from looking at a computer screen or riding in a car without symptoms. She is tachycardic and on exam has difficulty when fixation on head impulse.  MRI brain with no abnormalities to explain her symptoms. I am most concerned for inner ear pathology, particularly with hearing changes and tinnitus. ENT did not find anything on vestibular testing though. She had minimal improvement with vestibular therapy. Could this be related to #2 below?  Headaches - associated with photophobia, phonophobia, nausea, and cervicalgia. Vertiginous migraines are possibility. She states today that headaches are worse in the morning and improve throughout the day, suggesting a potential positional component. There is no evidence of papilledema. MRI showing partially empty sella may be normal finding, though IIH is still a consideration. She has seen only mild improvement on nortriptyline thus far.   Plan: -Lumbar puncture with opening pressure and routine analysis, IgG index, OCB, culture and stain, cytology - high volume tap if opening pressure is elevated -Increase nortriptyline to 50 mg at bedtime. May consider switching to Topamax if there is evidence of IIH  Return to clinic in 3 months  Total time spent reviewing records, interview, history/exam, documentation, and coordination of care on day of encounter:  50 min  Jacquelyne Balint, MD

## 2024-01-28 ENCOUNTER — Other Ambulatory Visit (HOSPITAL_COMMUNITY): Payer: Self-pay

## 2024-01-28 ENCOUNTER — Other Ambulatory Visit: Payer: Self-pay

## 2024-01-29 ENCOUNTER — Other Ambulatory Visit (HOSPITAL_COMMUNITY): Payer: Self-pay

## 2024-02-01 ENCOUNTER — Encounter: Payer: Self-pay | Admitting: Neurology

## 2024-02-01 ENCOUNTER — Other Ambulatory Visit (HOSPITAL_COMMUNITY): Payer: Self-pay

## 2024-02-01 ENCOUNTER — Ambulatory Visit (INDEPENDENT_AMBULATORY_CARE_PROVIDER_SITE_OTHER): Payer: BC Managed Care – PPO | Admitting: Neurology

## 2024-02-01 VITALS — BP 120/79 | HR 113 | Ht 67.0 in | Wt 249.0 lb

## 2024-02-01 DIAGNOSIS — H53149 Visual discomfort, unspecified: Secondary | ICD-10-CM

## 2024-02-01 DIAGNOSIS — R42 Dizziness and giddiness: Secondary | ICD-10-CM

## 2024-02-01 DIAGNOSIS — R110 Nausea: Secondary | ICD-10-CM

## 2024-02-01 DIAGNOSIS — M542 Cervicalgia: Secondary | ICD-10-CM

## 2024-02-01 DIAGNOSIS — G43709 Chronic migraine without aura, not intractable, without status migrainosus: Secondary | ICD-10-CM

## 2024-02-01 DIAGNOSIS — F40298 Other specified phobia: Secondary | ICD-10-CM

## 2024-02-01 DIAGNOSIS — R930 Abnormal findings on diagnostic imaging of skull and head, not elsewhere classified: Secondary | ICD-10-CM

## 2024-02-01 MED ORDER — NORTRIPTYLINE HCL 50 MG PO CAPS
50.0000 mg | ORAL_CAPSULE | Freq: Every day | ORAL | 5 refills | Status: DC
  Filled 2024-02-01: qty 30, 30d supply, fill #0
  Filled 2024-03-12: qty 30, 30d supply, fill #1
  Filled 2024-04-23: qty 30, 30d supply, fill #2
  Filled 2024-06-03: qty 30, 30d supply, fill #3
  Filled 2024-07-09: qty 30, 30d supply, fill #4
  Filled 2024-08-06 (×2): qty 30, 30d supply, fill #5

## 2024-02-01 NOTE — Patient Instructions (Signed)
I will get a lumbar puncture to analyze the opening pressure and look for evidence of inflammation.  I will also increase your nortriptyline to 50 mg daily.   I may change your medication depending on the results of the lumbar puncture.  I see you back in 3 months.  The physicians and staff at [facility] are committed to providing excellent care. You may receive a survey requesting feedback about your experience at our office. We strive to receive "very good" responses to the survey questions. If you feel that your experience would prevent you from giving the office a "very good " response, please contact our office to try to remedy the situation. We may be reached at [phone]. Thank you for taking the time out of your busy day to complete the survey.  Jacquelyne Balint, MD [facility]  Preventing Falls at [facility] are common, often dreaded events in the lives of older people. Aside from the obvious injuries and even death that may result, fall can cause wide-ranging consequences including loss of independence, mental decline, decreased activity and mobility. Younger people are also at risk of falling, especially those with chronic illnesses and fatigue.  Ways to reduce risk for falling Examine diet and medications. Warm foods and alcohol dilate blood vessels, which can lead to dizziness when standing. Sleep aids, antidepressants and pain medications can also increase the likelihood of a fall.  Get a vision exam. Poor vision, cataracts and glaucoma increase the chances of falling.  Check foot gear. Shoes should fit snugly and have a sturdy, nonskid sole and a broad, low heel  Participate in a physician-approved exercise program to build and maintain muscle strength and improve balance and coordination. Programs that use ankle weights or stretch bands are excellent for muscle-strengthening. Water aerobics programs and low-impact Tai Chi programs have also been shown to improve  balance and coordination.  Increase vitamin D intake. Vitamin D improves muscle strength and increases the amount of calcium the body is able to absorb and deposit in bones.  How to prevent falls from common hazards Floors - Remove all loose wires, cords, and throw rugs. Minimize clutter. Make sure rugs are anchored and smooth. Keep furniture in its usual place.  Chairs -- Use chairs with straight backs, armrests and firm seats. Add firm cushions to existing pieces to add height.  Bathroom - Install grab bars and non-skid tape in the tub or shower. Use a bathtub transfer bench or a shower chair with a back support Use an elevated toilet seat and/or safety rails to assist standing from a low surface. Do not use towel racks or bathroom tissue holders to help you stand.  Lighting - Make sure halls, stairways, and entrances are well-lit. Install a night light in your bathroom or hallway. Make sure there is a light switch at the top and bottom of the staircase. Turn lights on if you get up in the middle of the night. Make sure lamps or light switches are within reach of the bed if you have to get up during the night.  Kitchen - Install non-skid rubber mats near the sink and stove. Clean spills immediately. Store frequently used utensils, pots, pans between waist and eye level. This helps prevent reaching and bending. Sit when getting things out of lower cupboards.  Living room/ Bedrooms - Place furniture with wide spaces in between, giving enough room to move around. Establish a route through the living room that gives you something to hold onto  as you walk.  Stairs - Make sure treads, rails, and rugs are secure. Install a rail on both sides of the stairs. If stairs are a threat, it might be helpful to arrange most of your activities on the lower level to reduce the number of times you must climb the stairs.  Entrances and doorways - Install metal handles on the walls adjacent to the doorknobs of all  doors to make it more secure as you travel through the doorway.  Tips for maintaining balance Keep at least one hand free at all times. Try using a backpack or fanny pack to hold things rather than carrying them in your hands. Never carry objects in both hands when walking as this interferes with keeping your balance.  Attempt to swing both arms from front to back while walking. This might require a conscious effort if Parkinson's disease has diminished your movement. It will, however, help you to maintain balance and posture, and reduce fatigue.  Consciously lift your feet off of the ground when walking. Shuffling and dragging of the feet is a common culprit in losing your balance.  When trying to navigate turns, use a "U" technique of facing forward and making a wide turn, rather than pivoting sharply.  Try to stand with your feet shoulder-length apart. When your feet are close together for any length of time, you increase your risk of losing your balance and falling.  Do one thing at a time. Don't try to walk and accomplish another task, such as reading or looking around. The decrease in your automatic reflexes complicates motor function, so the less distraction, the better.  Do not wear rubber or gripping soled shoes, they might "catch" on the floor and cause tripping.  Move slowly when changing positions. Use deliberate, concentrated movements and, if needed, use a grab bar or walking aid. Count 15 seconds between each movement. For example, when rising from a seated position, wait 15 seconds after standing to begin walking.  If balance is a continuous problem, you might want to consider a walking aid such as a cane, walking stick, or walker. Once you've mastered walking with help, you might be ready to try it on your own again.

## 2024-02-04 NOTE — Addendum Note (Signed)
Addended by: Lenise Herald on: 02/04/2024 09:15 AM   Modules accepted: Orders

## 2024-02-07 ENCOUNTER — Encounter: Payer: Self-pay | Admitting: Neurology

## 2024-02-08 ENCOUNTER — Other Ambulatory Visit: Payer: Self-pay

## 2024-02-08 DIAGNOSIS — R42 Dizziness and giddiness: Secondary | ICD-10-CM

## 2024-02-27 ENCOUNTER — Telehealth: Payer: Self-pay

## 2024-02-27 NOTE — Telephone Encounter (Signed)
-----   Message from Antony Madura sent at 02/26/2024 11:53 AM EDT ----- Can you call [facility] and see if they can get the images to [facility] (the actual images and reports)? Thank you.  Riki Rusk ----- Message ----- From: Gunnar Bulla Sent: 02/26/2024   9:06 AM EDT To: Antony Madura, MD

## 2024-03-12 ENCOUNTER — Other Ambulatory Visit (HOSPITAL_COMMUNITY): Payer: Self-pay

## 2024-03-17 ENCOUNTER — Telehealth: Payer: Self-pay | Admitting: Neurology

## 2024-03-17 ENCOUNTER — Other Ambulatory Visit (HOSPITAL_COMMUNITY): Payer: Self-pay

## 2024-03-17 DIAGNOSIS — G43709 Chronic migraine without aura, not intractable, without status migrainosus: Secondary | ICD-10-CM

## 2024-03-17 MED ORDER — TOPIRAMATE 25 MG PO TABS
25.0000 mg | ORAL_TABLET | Freq: Every day | ORAL | 5 refills | Status: DC
  Filled 2024-03-17: qty 30, 30d supply, fill #0
  Filled 2024-04-23: qty 30, 30d supply, fill #1

## 2024-03-17 NOTE — Telephone Encounter (Signed)
 Pt called in and left a message with the access nurse. Pt had seen a neurologist in [place]. They told her to give Dr. Loleta Chance a call because Dr Loleta Chance was going to change her medication if it was not working for her and it is not. She needs to know what to do

## 2024-03-17 NOTE — Telephone Encounter (Signed)
 Patient saw [facility] who agreed with my assessment and plan. They are sending patient to ophtho. They agreed that switching medication from nortriptyline to topamax makes sense. Patient is also on board with this. I will start Topamax 25 mg daily. I warned her that this could take a couple of weeks to start to work, so we agreed to continue Nortriptyline 50 mg daily for now. We may decrease this in the future if Topamax is working for the patient.  Her LP is scheduled for 03/25/24.  All questions were answered.  Jacquelyne Balint, MD [facility]

## 2024-03-25 NOTE — Discharge Instructions (Signed)

## 2024-03-26 ENCOUNTER — Inpatient Hospital Stay
Admission: RE | Admit: 2024-03-26 | Discharge: 2024-03-26 | Disposition: A | Discharge status: Home / Self Care | Payer: Self-pay | Source: Ambulatory Visit | Attending: Neurology | Admitting: Neurology

## 2024-04-21 NOTE — Progress Notes (Deleted)
 NEUROLOGY FOLLOW UP OFFICE NOTE  Deanna Mack [id number]  Subjective:  Deanna Mack is a 48 y.o. year old right-handed female with a medical history of vit D deficiency, OSA, HTN, uterine fibroids, chronic low back pain, migraines who we last saw on 02/01/24 for dizziness and headaches.  To briefly review: 08/24/23: Patient had a partial hysterectomy on 06/27/23 and 3 days later patient started noticing lightheadedness and dizziness. It would happen when she went to stand up. It has been gradually getting worse. She notices in the shower that she can get lightheaded or dizzy (maybe with hot water ). When she is in the sun, her vision can get hazy or blurry. She also gets dizzy there.    She had one fall when in the kitchen going to the counter and got dizzy and fell. Daily she gets dizzy and feels she may fall. She has to catch herself. When she changes positions or moving her head, she will likely be dizzy. If she moves to fast, she will feel lightheaded and weird feeling in her stomach. She will feel room spinning as well. If she holds still, it can resolve in seconds to minutes. When she is still, she does not really have symptoms. She will also get dizzy when turning in bed, maybe more when turning to the right than the left.   She has associated headache, also progressively getting worse. These started weeks after her onset of dizziness though. She describes it as a pressure sensation in her forehead and temple areas. She endorses photophobia, phonophobia, nausea. She denies any worsening of headache with position change. She has a more remote history of migraines (about 20 years ago). The headache now is more severe. Her previous migraines were more in the back of her head. There was no previous photophobia or phonophobia. She currently takes advil  or tylenol  for her headache. She does not think they help. She is taking tylenol  1-2 per day of late.    She also endorses  neck pain.   She has been prescribed meclizine  25 mg q8h PRN. She does not think this has really helped.   Her grandmother had migraines. She also had a cousin with a brain tumor.   EtOH use: none Restrictive diet: no   Patient started nortriptyline  20 mg at bedtime on 08/24/23.   10/12/23: Regarding her vertigo, she still has it intermittently. She tends to get it more when changing positions. She also mentions getting lightheaded when she reaches up to grab something above her head.   She feels her hearing is muffled, as if she is in a pool. She is not sure which ear this was in. She also endorses tinnitus in her right ear. She also endorses significant anxiety that has worsened of late with chest pain and a lot of emotions since her symptoms. She is not sure if it is all related to her medical condition or if it is more generalized.   Her headaches have improved a little. She still has extreme light sensitivity. She will get a headache (tightness in the head) when she goes out into the sun. She feels like laying down is okay for her head, but standing up makes them worse. She will feel increased pressure in her head but then go away. Her average headache is about 20-30 minutes. She is taking Nortriptyline  20 mg daily. She does not think she has clear side effects, but maybe the anxiety got worse after starting the medication.  She is still having headaches most days. She has to take tylenol  for more severe headache 1-2 times per week. The headache will improve with tylenol  and laying down. She also have have occasional neck pain.   She endorses some blurry vision and occasional double vision. She denies any vision loss. She saw [facility] one week ago. She had some changes to her vision, but states they did not see any increased pressure on her eye (presumably no papilledema).    She has never seen ENT. She has not done any therapy.   Labs were normal.   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  I increased nortriptyline  to 30 mg at bedtime on 10/12/23.  02/01/24: Per my phone note from 11/27/23: Called patient to discuss her MyChart message today: "Hi, Dr. Genita Keys- I hope all is well.  I've had therapy at [facility] twice now.  The last time was December 5, and the first time was November 27.  It took me about 3 hours to recover from the first session with ongoing symptoms of vertigo and nausea.  The session from December 5, I'm having symptoms of vertigo and nausea medication, and the pressure behind my ears and constant lightheadedness.  When I initially left the session in a wheelchair, I was in a constant spin, the nausea was unbearable.    When I finally got out of the car to the front door I vomitted, and urinated on myself (so embarrassing) with the first few steps through the door and then vomitted again.  I can't ride in a car now without getting dizzy since I began this last session.  I  took the Zofran  over the weekend and that has helped with the nausea.     At this point I don't want to go to therapy because it makes me so sick and weak.  As if I wasn't already dizzy and nauseous.  Oh I forgot to mention the weakness I experienced with each visit.  The last visit being the worst.  I had shakiness  and weakness.  Also I have been having more headaches.  Not migraines, but headaches behind my ears and top of my head.   I'm writing for information or confirmation that I'm in the right path, that this is normal through this process.  Mentally this is taking a toll on me, because [employer] for my long term disability keeps denying me.  And I can't afford to pay my bills, and I'm still not well enough to return to work"   Patient is not doing as well with current PT as she was with previous PT who was taking things slower with her. She had to switch due to very high co-pay. She is currently very dizzy,  nauseated, and weak after current PT. She is having to hold off on therapy due to severity of symptoms. I understand this and find it reasonable. I did mention that I thought PT would be a good long term strategy, maybe she just needs a break.   She has also not heard back from ENT, so I will look into this referral.   I also offered sooner follow up if needed. Patient will keep me up to date on symptoms.   Patient did see ENT whose ddx included BBPV, vestibular migraine. Vestibular testing was normal per reports from [facility] phone note. She went to vestibular rehab that did help some.  She did not go after our talk on 11/27/23.   Overall, she feels maybe a little better. She feels like she can easily get dizzy when turning her head. She gets very dizzy in the car. She feels her hearing is muffled. She has some blurry vision, but things are better with glasses. She gets short of breath occasionally and will feel almost a panic like attack.   She continues to get headaches. She will have a headache 1-2 times of week. Anxiety or stress is a primary trigger. She has been taking nortriptyline  30 mg daily. She has more pressure in her head in the morning. It gets better as the day goes on.   She lost her job at [employer]. She is currently getting benefits from [place].  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 They occur with position change or turning of head and per patient report are very disabling, even preventing her from looking at a computer screen or riding in a car without symptoms. She is tachycardic and on exam has difficulty when fixation on head impulse.  MRI brain with no abnormalities to explain her symptoms. I am most concerned for inner ear pathology, particularly with hearing changes and tinnitus. ENT did not find anything on vestibular testing though. She had minimal  improvement with vestibular therapy. Could this be related to #2 below?  Headaches - associated with photophobia, phonophobia, nausea, and cervicalgia. Vertiginous migraines are possibility. She states today that headaches are worse in the morning and improve throughout the day, suggesting a potential positional component. There is no evidence of papilledema. MRI showing partially empty sella may be normal finding, though IIH is still a consideration. She has seen only mild improvement on nortriptyline  thus far.    Plan: -Lumbar puncture with opening pressure and routine analysis, IgG index, OCB, culture and stain, cytology - high volume tap if opening pressure is elevated -Increase nortriptyline  to 50 mg at bedtime. May consider switching to Topamax  if there is evidence of IIH  Since their last visit: Patient decided to seek a second opinion at [facility]. Per my phone note on 03/17/24: Patient saw [facility] who agreed with my assessment and plan. They are sending patient to ophtho. They agreed that switching medication from nortriptyline  to topamax  makes sense. Patient is also on board with this. I will start Topamax  25 mg daily. I warned her that this could take a couple of weeks to start to work, so we agreed to continue Nortriptyline  50 mg daily for now. We may decrease this in the future if Topamax  is working for the patient.   Her LP is scheduled for 03/25/24.  Patient did not have LP as scheduled.***  Dizziness? Headaches?   MEDICATIONS:  Outpatient Encounter Medications as of 04/30/2024  Medication Sig   acetaminophen  (TYLENOL ) 500 MG tablet Take 2 tablets (1,000 mg total) by mouth every 8 (eight) hours as needed for mild pain or moderate pain.   cyclobenzaprine  (FLEXERIL ) 10 MG tablet Take 1 tablet (10 mg) by mouth every 8 hours as needed for muscle spasms   cyclobenzaprine  (FLEXERIL ) 10 MG tablet Take 1 tablet by mouth up to every 8 hours (at bedtime) as needed (Patient  not taking: Reported on 02/01/2024)   ibuprofen  (ADVIL ) 600 MG tablet Take 1 tablet (600 mg total) by mouth every 6 (six) hours as needed.   meclizine  (ANTIVERT ) 25  MG tablet Take 1 tablet (25 mg total) by mouth every 8 (eight) hours as needed for 10 days. (Patient not taking: Reported on 02/01/2024)   nortriptyline  (PAMELOR ) 50 MG capsule Take 1 capsule (50 mg total) by mouth at bedtime.   ondansetron  (ZOFRAN -ODT) 4 MG disintegrating tablet Take 1 tablet (4 mg total) by mouth every 8 (eight) hours as needed.   oxyCODONE  (OXY IR/ROXICODONE ) 5 MG immediate release tablet Take 1 tablet (5 mg total) by mouth every 4 (four) hours as needed for moderate pain. (Patient not taking: Reported on 02/01/2024)   spironolactone  (ALDACTONE ) 50 MG tablet Take 1 tablet (50 mg total) by mouth daily.   topiramate  (TOPAMAX ) 25 MG tablet Take 1 tablet (25 mg total) by mouth daily.   [DISCONTINUED] norethindrone  (AYGESTIN ) 5 MG tablet Take 2 tablets (10 mg total) by mouth daily.   No facility-administered encounter medications on file as of 04/30/2024.    PAST MEDICAL HISTORY: Past Medical History:  Diagnosis Date   COVID-19 09/2019   Family history of adverse reaction to anesthesia    Patient states her grandmother was difficult to arouse after anesthesia (when she was 48 years old.)   Hypertension    Follows w/ Dr. Merl Star @ [facility].   Low back pain    during cycle only   Sleep apnea    Uterine fibroid    Wears glasses     PAST SURGICAL HISTORY: Past Surgical History:  Procedure Laterality Date   DIAGNOSTIC LAPAROSCOPY  1996   LAPAROTOMY N/A 06/27/2023   Procedure: LAPAROTOMY;  Surgeon: Arlee Lace, MD;  Location: [facility];  Service: Gynecology;  Laterality: N/A;   ROBOTIC ASSISTED LAPAROSCOPIC HYSTERECTOMY AND SALPINGECTOMY Bilateral 06/27/2023   Procedure: XI ROBOTIC ASSISTED LAPAROSCOPIC HYSTERECTOMY AND SALPINGECTOMY;  Surgeon: Arlee Lace, MD;  Location: [facility];  Service: Gynecology;  Laterality: Bilateral;   TUBAL LIGATION  2000    ALLERGIES: Allergies  Allergen Reactions   Gabapentin Other (See Comments)    Dizziness and lightheadedness.   Hydrochlorothiazide  Swelling    FAMILY HISTORY: Family History  Problem Relation Age of Onset   High blood pressure Father    COPD Father    Heart Problems Father    Breast cancer Paternal Grandmother     SOCIAL HISTORY: Social History   Tobacco Use   Smoking status: Never   Smokeless tobacco: Never  Vaping Use   Vaping status: Never Used  Substance Use Topics   Alcohol use: Not Currently   Drug use: Not Currently   Social History   Social History Narrative   Are you right handed or left handed? Right   Are you currently employed ? yes   What is your current occupation? [employer]   Do you live at home alone?no   Who lives with you? wife   What type of home do you live in: 1 story or 2 story? two    Caffeine 2 - 4 cups a week       Objective:  Vital Signs:  There were no vitals taken for this visit.  ***  Labs and Imaging review: No new results***  Previously reviewed results: 08/24/23: B1 wnl B12: 496 Folate wnl   External labs: TSH (07/24/23) wnl BMP (07/24/23) unremarkable CBC (07/24/23) unremarkable Lipid panel (07/24/23): total cholesterol 184, TG 122, LDL 109 Ferritin (09/04/22): 29.9   CT head wo contrast (08/09/23): IMPRESSION: 1. Negative for hemorrhage or intracranial mass. 2. Age indeterminate small  hypodensity within the right white matter but some artifact limits assessment. This could represent mild age indeterminate small vessel ischemic change.   MRI brain w/wo contrast (09/30/23): IMPRESSION: 1.  No evidence of an acute intracranial abnormality. 2. Subcentimeter focus within the mid right frontal lobe white matter favored to reflect a chronic lacunar infarct (rather than a prominent perivascular space). This corresponds with the  finding described on the prior head CT of 08/09/2023. 3. Few tiny foci of T2 FLAIR hyperintense signal abnormality elsewhere within the cerebral white matter. Findings are nonspecific and differential considerations include early/minimal chronic small vessel ischemic disease and sequelae of chronic migraine headaches, among others. 4. Partially empty sella turcica. This finding can reflect incidental anatomic variation, or alternatively, it can be associated with chronic idiopathic intracranial hypertension (pseudotumor cerebri). 5. Abnormal T1 hypointense marrow signal within the calvarium and imaged portions of the cervical spine. While this finding can reflect a marrow infiltrative process, the most common causes include chronic anemia, smoking and obesity.  Audiologic/vestibular testing (01/08/24 - external): Otoscopy: Otoscopic examination showed canals free of obstructive cerumen, bilaterally.  Tympanometry: Tympanometry was completed due to reported history. Results were: Right Ear: Type A - Normal compliance, middle ear pressure, and ear canal volume. Left Ear: Type A - Normal compliance, middle ear pressure, and ear canal volume.  Audiometric Results: Results were obtained using headphones. Test reliability was good.  Test was completed in Sound [place] 1 and printed out using [employer] Suite. The Sound Rollin Clock was last calibrated on September 10, 2023.  Puretone Thresholds: Right ear results show normal hearing for the frequencies [phone] Hz.  Left ear results show normal hearing for the frequencies [phone] Hz.   Results are symmetric.  Speech Reception Thresholds: Completed using monitored live voice. Right Ear: 15 dBHL; which is in agreement with PTA.  Left Ear: 10 dBHL; which is in agreement with PTA.   Word Recognition Testing: Completed using a recording of [id number]. Right Ear: 88 % at 55 dBHL with 35 dBem, which is a normal listening level. Left Ear: 92 % at 55 dBHL with  35 dBem, which is a normal listening level.  Impression: Normal hearing with normal middle ear function, bilaterally.  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

## 2024-04-30 ENCOUNTER — Ambulatory Visit: Payer: BC Managed Care – PPO | Admitting: Neurology

## 2024-05-05 DIAGNOSIS — R053 Chronic cough: Secondary | ICD-10-CM | POA: Diagnosis not present

## 2024-05-06 ENCOUNTER — Other Ambulatory Visit (HOSPITAL_COMMUNITY): Payer: Self-pay

## 2024-05-06 MED ORDER — AMOXICILLIN-POT CLAVULANATE 875-125 MG PO TABS
1.0000 | ORAL_TABLET | Freq: Two times a day (BID) | ORAL | 0 refills | Status: AC
Start: 2024-05-06 — End: ?
  Filled 2024-05-06: qty 20, 10d supply, fill #0

## 2024-05-07 ENCOUNTER — Other Ambulatory Visit (HOSPITAL_COMMUNITY): Payer: Self-pay

## 2024-05-07 MED ORDER — HYDROCODONE BIT-HOMATROP MBR 5-1.5 MG/5ML PO SOLN
5.0000 mL | Freq: Four times a day (QID) | ORAL | 0 refills | Status: AC
  Filled 2024-05-07: qty 140, 7d supply, fill #0

## 2024-05-07 NOTE — Progress Notes (Unsigned)
 NEUROLOGY FOLLOW UP OFFICE NOTE  Deanna Mack  Subjective:  Deanna Mack is a 48 y.o. year old right-handed female with a medical history of vit D deficiency, OSA, HTN, uterine fibroids, chronic low back pain, migraines who we last saw on 02/01/24 for dizziness and headaches.  To briefly review: 08/24/23: Patient had a partial hysterectomy on 06/27/23 and 3 days later patient started noticing lightheadedness and dizziness. It would happen when she went to stand up. It has been gradually getting worse. She notices in the shower that she can get lightheaded or dizzy (maybe with hot water ). When she is in the sun, her vision can get hazy or blurry. She also gets dizzy there.    She had one fall when in the kitchen going to the counter and got dizzy and fell. Daily she gets dizzy and feels she may fall. She has to catch herself. When she changes positions or moving her head, she will likely be dizzy. If she moves to fast, she will feel lightheaded and weird feeling in her stomach. She will feel room spinning as well. If she holds still, it can resolve in seconds to minutes. When she is still, she does not really have symptoms. She will also get dizzy when turning in bed, maybe more when turning to the right than the left.   She has associated headache, also progressively getting worse. These started weeks after her onset of dizziness though. She describes it as a pressure sensation in her forehead and temple areas. She endorses photophobia, phonophobia, nausea. She denies any worsening of headache with position change. She has a more remote history of migraines (about 20 years ago). The headache now is more severe. Her previous migraines were more in the back of her head. There was no previous photophobia or phonophobia. She currently takes advil  or tylenol  for her headache. She does not think they help. She is taking tylenol  1-2 per day of late.    She also endorses  neck pain.   She has been prescribed meclizine  25 mg q8h PRN. She does not think this has really helped.   Her grandmother had migraines. She also had a cousin with a brain tumor.   EtOH use: none Restrictive diet: no   Patient started nortriptyline  20 mg at bedtime on 08/24/23.   10/12/23: Regarding her vertigo, she still has it intermittently. She tends to get it more when changing positions. She also mentions getting lightheaded when she reaches up to grab something above her head.   She feels her hearing is muffled, as if she is in a pool. She is not sure which ear this was in. She also endorses tinnitus in her right ear. She also endorses significant anxiety that has worsened of late with chest pain and a lot of emotions since her symptoms. She is not sure if it is all related to her medical condition or if it is more generalized.   Her headaches have improved a little. She still has extreme light sensitivity. She will get a headache (tightness in the head) when she goes out into the sun. She feels like laying down is okay for her head, but standing up makes them worse. She will feel increased pressure in her head but then go away. Her average headache is about 20-30 minutes. She is taking Nortriptyline  20 mg daily. She does not think she has clear side effects, but maybe the anxiety got worse after starting the medication.  She is still having headaches most days. She has to take tylenol  for more severe headache 1-2 times per week. The headache will improve with tylenol  and laying down. She also have have occasional neck pain.   She endorses some blurry vision and occasional double vision. She denies any vision loss. She saw [facility] one week ago. She had some changes to her vision, but states they did not see any increased pressure on her eye (presumably no papilledema).    She has never seen ENT. She has not done any therapy.   Labs were normal.   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  I increased nortriptyline  to 30 mg at bedtime on 10/12/23.  02/01/24: Per my phone note from 11/27/23: Called patient to discuss her MyChart message today: "Hi, Dr. Genita Keys- I hope all is well.  I've had therapy at [facility] twice now.  The last time was December 5, and the first time was November 27.  It took me about 3 hours to recover from the first session with ongoing symptoms of vertigo and nausea.  The session from December 5, I'm having symptoms of vertigo and nausea medication, and the pressure behind my ears and constant lightheadedness.  When I initially left the session in a wheelchair, I was in a constant spin, the nausea was unbearable.    When I finally got out of the car to the front door I vomitted, and urinated on myself (so embarrassing) with the first few steps through the door and then vomitted again.  I can't ride in a car now without getting dizzy since I began this last session.  I  took the Zofran  over the weekend and that has helped with the nausea.     At this point I don't want to go to therapy because it makes me so sick and weak.  As if I wasn't already dizzy and nauseous.  Oh I forgot to mention the weakness I experienced with each visit.  The last visit being the worst.  I had shakiness  and weakness.  Also I have been having more headaches.  Not migraines, but headaches behind my ears and top of my head.   I'm writing for information or confirmation that I'm in the right path, that this is normal through this process.  Mentally this is taking a toll on me, because [employer] for my long term disability keeps denying me.  And I can't afford to pay my bills, and I'm still not well enough to return to work"   Patient is not doing as well with current PT as she was with previous PT who was taking things slower with her. She had to switch due to very high co-pay. She is currently very dizzy,  nauseated, and weak after current PT. She is having to hold off on therapy due to severity of symptoms. I understand this and find it reasonable. I did mention that I thought PT would be a good long term strategy, maybe she just needs a break.   She has also not heard back from ENT, so I will look into this referral.   I also offered sooner follow up if needed. Patient will keep me up to date on symptoms.   Patient did see ENT whose ddx included BBPV, vestibular migraine. Vestibular testing was normal per reports from [facility] phone note. She went to vestibular rehab that did help some.  She did not go after our talk on 11/27/23.   Overall, she feels maybe a little better. She feels like she can easily get dizzy when turning her head. She gets very dizzy in the car. She feels her hearing is muffled. She has some blurry vision, but things are better with glasses. She gets short of breath occasionally and will feel almost a panic like attack.   She continues to get headaches. She will have a headache 1-2 times of week. Anxiety or stress is a primary trigger. She has been taking nortriptyline  30 mg daily. She has more pressure in her head in the morning. It gets better as the day goes on.   She lost her job at [employer]. She is currently getting benefits from [place].  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 They occur with position change or turning of head and per patient report are very disabling, even preventing her from looking at a computer screen or riding in a car without symptoms. She is tachycardic and on exam has difficulty when fixation on head impulse.  MRI brain with no abnormalities to explain her symptoms. I am most concerned for inner ear pathology, particularly with hearing changes and tinnitus. ENT did not find anything on vestibular testing though. She had minimal  improvement with vestibular therapy. Could this be related to #2 below?  Headaches - associated with photophobia, phonophobia, nausea, and cervicalgia. Vertiginous migraines are possibility. She states today that headaches are worse in the morning and improve throughout the day, suggesting a potential positional component. There is no evidence of papilledema. MRI showing partially empty sella may be normal finding, though IIH is still a consideration. She has seen only mild improvement on nortriptyline  thus far.    Plan: -Lumbar puncture with opening pressure and routine analysis, IgG index, OCB, culture and stain, cytology - high volume tap if opening pressure is elevated -Increase nortriptyline  to 50 mg at bedtime. May consider switching to Topamax  if there is evidence of IIH  Since their last visit: Patient decided to seek a second opinion at [facility]. Per my phone note on 03/17/24: Patient saw [facility] who agreed with my assessment and plan. They are sending patient to ophtho. They agreed that switching medication from nortriptyline  to topamax  makes sense. Patient is also on board with this. I will start Topamax  25 mg daily. I warned her that this could take a couple of weeks to start to work, so we agreed to continue Nortriptyline  50 mg daily for now. We may decrease this in the future if Topamax  is working for the patient.   Her LP is scheduled for 03/25/24.  Patient recently had a sinus infection that turned into PNA (~05/05/24). She was given abx. Her dizziness got worse after getting the PNA. She feels dizzy almost constantly. She cannot move her head or move to fast. She has had a constant headache.  Prior to this infection, her headaches were improving and her dizziness was not as frequent. She was having 1-2 headaches per week. She was dizzy when she had a headache. She has occasional blurry vision, but no vision loss.  She is currently on Topamax  25 mg daily and  nortriptyline  50 mg daily. Patient cancelled her LP. She saw eye doctor who didn't see any fluid and she didn't want to do the LP, so she cancelled it.  MEDICATIONS:  Outpatient Encounter Medications as of 05/15/2024  Medication Sig   acetaminophen  (TYLENOL ) 500 MG tablet Take 2 tablets (1,000 mg total) by mouth every 8 (eight) hours as needed for mild pain or moderate pain.   amoxicillin -clavulanate (AUGMENTIN ) 875-125 MG tablet Take 1 tablet by mouth every 12 (twelve) hours for 10 days   cyclobenzaprine  (FLEXERIL ) 10 MG tablet Take 1 tablet (10 mg) by mouth every 8 hours as needed for muscle spasms (Patient not taking: Reported on 05/15/2024)   cyclobenzaprine  (FLEXERIL ) 10 MG tablet Take 1 tablet by mouth up to every 8 hours (at bedtime) as needed (Patient not taking: Reported on 02/01/2024)   HYDROcodone  bit-homatropine (HYCODAN) 5-1.5 MG/5ML syrup Take 5 mLs by mouth every 6 (six) hours for 7 days. (Patient not taking: Reported on 05/15/2024)   ibuprofen  (ADVIL ) 600 MG tablet Take 1 tablet (600 mg total) by mouth every 6 (six) hours as needed. (Patient not taking: Reported on 05/15/2024)   meclizine  (ANTIVERT ) 25 MG tablet Take 1 tablet (25 mg total) by mouth every 8 (eight) hours as needed for 10 days. (Patient not taking: Reported on 05/15/2024)   nortriptyline  (PAMELOR ) 50 MG capsule Take 1 capsule (50 mg total) by mouth at bedtime.   ondansetron  (ZOFRAN -ODT) 4 MG disintegrating tablet Take 1 tablet (4 mg total) by mouth every 8 (eight) hours as needed.   oxyCODONE  (OXY IR/ROXICODONE ) 5 MG immediate release tablet Take 1 tablet (5 mg total) by mouth every 4 (four) hours as needed for moderate pain. (Patient not taking: Reported on 08/24/2023)   spironolactone  (ALDACTONE ) 50 MG tablet Take 1 tablet (50 mg total) by mouth daily.   topiramate  (TOPAMAX ) 25 MG tablet Take 2 tablets (50 mg total) by mouth daily.   [DISCONTINUED] norethindrone  (AYGESTIN ) 5 MG tablet Take 2 tablets (10 mg total) by mouth  daily.   [DISCONTINUED] topiramate  (TOPAMAX ) 25 MG tablet Take 1 tablet (25 mg total) by mouth daily.   No facility-administered encounter medications on file as of 05/15/2024.    PAST MEDICAL HISTORY: Past Medical History:  Diagnosis Date   COVID-19 09/2019   Family history of adverse reaction to anesthesia    Patient states her grandmother was difficult to arouse after anesthesia (when she was 48 years old.)   Hypertension    Follows w/ Dr. Merl Star @ [facility].   Low back pain    during cycle only   Sleep apnea    Uterine fibroid    Wears glasses     PAST SURGICAL HISTORY: Past Surgical History:  Procedure Laterality Date   DIAGNOSTIC LAPAROSCOPY  1996   LAPAROTOMY N/A 06/27/2023   Procedure: LAPAROTOMY;  Surgeon: Arlee Lace, MD;  Location: [facility];  Service: Gynecology;  Laterality: N/A;   ROBOTIC ASSISTED LAPAROSCOPIC HYSTERECTOMY AND SALPINGECTOMY Bilateral 06/27/2023   Procedure: XI ROBOTIC ASSISTED LAPAROSCOPIC HYSTERECTOMY AND SALPINGECTOMY;  Surgeon: Arlee Lace, MD;  Location: [facility];  Service: Gynecology;  Laterality: Bilateral;   TUBAL LIGATION  2000    ALLERGIES: Allergies  Allergen Reactions   Gabapentin Other (See Comments)    Dizziness and lightheadedness.   Hydrochlorothiazide  Swelling    FAMILY HISTORY: Family History  Problem Relation Age of Onset   High blood pressure Father    COPD Father    Heart Problems Father    Breast cancer Paternal Grandmother     SOCIAL HISTORY: Social History   Tobacco Use   Smoking status: Never   Smokeless tobacco: Never  Vaping  Use   Vaping status: Never Used  Substance Use Topics   Alcohol use: Not Currently   Drug use: Not Currently   Social History   Social History Narrative   Are you right handed or left handed? Right   Are you currently employed ? yes   What is your current occupation? [employer]   Do you live at home alone?no   Who lives with you?  wife   What type of home do you live in: 1 story or 2 story? two    Caffeine 2 - 4 cups a week       Objective:  Vital Signs:  BP 120/78   Pulse 92   Ht 5\' 7"  (1.702 m)   Wt 240 lb (108.9 kg)   SpO2 99%   BMI 37.59 kg/m   General: No acute distress. Does not move head due to concern of getting dizzy. Head:  Normocephalic/atraumatic Eyes:  fundi examined, disc margins clear; no obvious papilledema Neck: supple, no paraspinal tenderness Lungs: Non-labored breathing on room air   Neurological Exam: Mental status: alert and oriented, speech fluent and not dysarthric, language intact.  Cranial nerves: CN I: not tested CN II: pupils equal, round and reactive to light, visual fields intact CN III, IV, VI:  full range of motion, no nystagmus, no ptosis CN V: facial sensation intact. CN VII: upper and lower face symmetric CN VIII: hearing intact CN IX, X: uvula midline CN XI: sternocleidomastoid and trapezius muscles intact CN XII: tongue midline  Head impulse normal  Bulk & Tone: normal, no fasciculations. Motor:  muscle strength 5/5 throughout Deep Tendon Reflexes:  2+ throughout.   Sensation:  Light touch sensation intact. Finger to nose testing:  Without dysmetria.    Gait:  Walks very slow to avoid moving head and triggering dizziness.   Labs and Imaging review: No new results  Previously reviewed results: 08/24/23: B1 wnl B12: 496 Folate wnl   External labs: TSH (07/24/23) wnl BMP (07/24/23) unremarkable CBC (07/24/23) unremarkable Lipid panel (07/24/23): total cholesterol 184, TG 122, LDL 109 Ferritin (09/04/22): 29.9   CT head wo contrast (08/09/23): IMPRESSION: 1. Negative for hemorrhage or intracranial mass. 2. Age indeterminate small hypodensity within the right white matter but some artifact limits assessment. This could represent mild age indeterminate small vessel ischemic change.   MRI brain w/wo contrast (09/30/23): IMPRESSION: 1.  No evidence of an  acute intracranial abnormality. 2. Subcentimeter focus within the mid right frontal lobe white matter favored to reflect a chronic lacunar infarct (rather than a prominent perivascular space). This corresponds with the finding described on the prior head CT of 08/09/2023. 3. Few tiny foci of T2 FLAIR hyperintense signal abnormality elsewhere within the cerebral white matter. Findings are nonspecific and differential considerations include early/minimal chronic small vessel ischemic disease and sequelae of chronic migraine headaches, among others. 4. Partially empty sella turcica. This finding can reflect incidental anatomic variation, or alternatively, it can be associated with chronic idiopathic intracranial hypertension (pseudotumor cerebri). 5. Abnormal T1 hypointense marrow signal within the calvarium and imaged portions of the cervical spine. While this finding can reflect a marrow infiltrative process, the most common causes include chronic anemia, smoking and obesity.  Audiologic/vestibular testing (01/08/24 - external): Otoscopy: Otoscopic examination showed canals free of obstructive cerumen, bilaterally.  Tympanometry: Tympanometry was completed due to reported history. Results were: Right Ear: Type A - Normal compliance, middle ear pressure, and ear canal volume. Left Ear: Type A - Normal compliance,  middle ear pressure, and ear canal volume.  Audiometric Results: Results were obtained using headphones. Test reliability was good.  Test was completed in Sound [place] 1 and printed out using [employer] Suite. The Sound [place] Clock was last calibrated on September 10, 2023.  Puretone Thresholds: Right ear results show normal hearing for the frequencies [phone] Hz.  Left ear results show normal hearing for the frequencies [phone].   Results are symmetric.  Speech Reception Thresholds: Completed using monitored live voice. Right Ear: 15 dBHL; which is in agreement with PTA.  Left  Ear: 10 dBHL; which is in agreement with PTA.   Word Recognition Testing: Completed using a recording of [id number]. Right Ear: 88 % at 55 dBHL with 35 dBem, which is a normal listening level. Left Ear: 92 % at 55 dBHL with 35 dBem, which is a normal listening level.  Impression: Normal hearing with normal middle ear function, bilaterally.  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 Her dizziness is very out of proportion to what I would expect with migraines, but vertiginous migraine is still possible as the dizziness does seem to come with the headaches. MRI brain showed a partially empty sella, so IIH was another consideration. Patient does not want to do LP though, so this is unclear. In any case, patient was improving on topamax  25 mg and nortriptyline  50 mg daily, but recently worsened after sinus infection and PNA. This recent illness likely made symptoms worse, but I explained to patient that she was improving is a good sign and likely she will continue to improve when she gets over the PNA.   Plan: For headaches: -Increase topamax  to 50 mg daily -Continue nortriptyline  50 mg daily -Keep headache diary  -Continue CPAP -Fall precautions discussed  Return to clinic in 6 months  Total time spent reviewing records, interview, history/exam, documentation, and coordination of care on day of encounter:  35 min  Rommie Coats, MD

## 2024-05-08 DIAGNOSIS — G4733 Obstructive sleep apnea (adult) (pediatric): Secondary | ICD-10-CM | POA: Diagnosis not present

## 2024-05-15 ENCOUNTER — Ambulatory Visit (INDEPENDENT_AMBULATORY_CARE_PROVIDER_SITE_OTHER): Payer: Self-pay | Admitting: Neurology

## 2024-05-15 ENCOUNTER — Other Ambulatory Visit (HOSPITAL_COMMUNITY): Payer: Self-pay

## 2024-05-15 ENCOUNTER — Encounter: Payer: Self-pay | Admitting: Neurology

## 2024-05-15 VITALS — BP 120/78 | HR 92 | Ht 67.0 in | Wt 240.0 lb

## 2024-05-15 DIAGNOSIS — R110 Nausea: Secondary | ICD-10-CM

## 2024-05-15 DIAGNOSIS — G43709 Chronic migraine without aura, not intractable, without status migrainosus: Secondary | ICD-10-CM

## 2024-05-15 DIAGNOSIS — H53149 Visual discomfort, unspecified: Secondary | ICD-10-CM

## 2024-05-15 DIAGNOSIS — R930 Abnormal findings on diagnostic imaging of skull and head, not elsewhere classified: Secondary | ICD-10-CM

## 2024-05-15 DIAGNOSIS — M542 Cervicalgia: Secondary | ICD-10-CM

## 2024-05-15 DIAGNOSIS — R42 Dizziness and giddiness: Secondary | ICD-10-CM

## 2024-05-15 DIAGNOSIS — F40298 Other specified phobia: Secondary | ICD-10-CM

## 2024-05-15 MED ORDER — TOPIRAMATE 25 MG PO TABS
50.0000 mg | ORAL_TABLET | Freq: Every day | ORAL | 11 refills | Status: AC
  Filled 2024-05-15: qty 60, 30d supply, fill #0
  Filled 2024-06-14 – 2024-06-16 (×2): qty 60, 30d supply, fill #1
  Filled 2024-07-09: qty 60, 30d supply, fill #2
  Filled 2024-09-01: qty 60, 30d supply, fill #3

## 2024-05-15 NOTE — Patient Instructions (Signed)
 For headaches: -Increase topamax  to 50 mg daily -Continue nortriptyline  50 mg daily -Keep headache diary  -Continue CPAP  Return to clinic in 6 months  Please let me know if you have any questions or concerns in the meantime.   The physicians and staff at [facility] are committed to providing excellent care. You may receive a survey requesting feedback about your experience at our office. We strive to receive "very good" responses to the survey questions. If you feel that your experience would prevent you from giving the office a "very good " response, please contact our office to try to remedy the situation. We may be reached at [phone]. Thank you for taking the time out of your busy day to complete the survey.  Rommie Coats, MD [facility]  Preventing Falls at [facility] are common, often dreaded events in the lives of older people. Aside from the obvious injuries and even death that may result, fall can cause wide-ranging consequences including loss of independence, mental decline, decreased activity and mobility. Younger people are also at risk of falling, especially those with chronic illnesses and fatigue.  Ways to reduce risk for falling Examine diet and medications. Warm foods and alcohol dilate blood vessels, which can lead to dizziness when standing. Sleep aids, antidepressants and pain medications can also increase the likelihood of a fall.  Get a vision exam. Poor vision, cataracts and glaucoma increase the chances of falling.  Check foot gear. Shoes should fit snugly and have a sturdy, nonskid sole and a broad, low heel  Participate in a physician-approved exercise program to build and maintain muscle strength and improve balance and coordination. Programs that use ankle weights or stretch bands are excellent for muscle-strengthening. Water  aerobics programs and low-impact Tai Chi programs have also been shown to improve balance and coordination.  Increase  vitamin D intake. Vitamin D improves muscle strength and increases the amount of calcium the body is able to absorb and deposit in bones.  How to prevent falls from common hazards Floors - Remove all loose wires, cords, and throw rugs. Minimize clutter. Make sure rugs are anchored and smooth. Keep furniture in its usual place.  Chairs -- Use chairs with straight backs, armrests and firm seats. Add firm cushions to existing pieces to add height.  Bathroom - Install grab bars and non-skid tape in the tub or shower. Use a bathtub transfer bench or a shower chair with a back support Use an elevated toilet seat and/or safety rails to assist standing from a low surface. Do not use towel racks or bathroom tissue holders to help you stand.  Lighting - Make sure halls, stairways, and entrances are well-lit. Install a night light in your bathroom or hallway. Make sure there is a light switch at the top and bottom of the staircase. Turn lights on if you get up in the middle of the night. Make sure lamps or light switches are within reach of the bed if you have to get up during the night.  Kitchen - Install non-skid rubber mats near the sink and stove. Clean spills immediately. Store frequently used utensils, pots, pans between waist and eye level. This helps prevent reaching and bending. Sit when getting things out of lower cupboards.  Living room/ Bedrooms - Place furniture with wide spaces in between, giving enough room to move around. Establish a route through the living room that gives you something to hold onto as you walk.  Stairs - Make sure treads,  rails, and rugs are secure. Install a rail on both sides of the stairs. If stairs are a threat, it might be helpful to arrange most of your activities on the lower level to reduce the number of times you must climb the stairs.  Entrances and doorways - Install metal handles on the walls adjacent to the doorknobs of all doors to make it more secure as you  travel through the doorway.  Tips for maintaining balance Keep at least one hand free at all times. Try using a backpack or fanny pack to hold things rather than carrying them in your hands. Never carry objects in both hands when walking as this interferes with keeping your balance.  Attempt to swing both arms from front to back while walking. This might require a conscious effort if Parkinson's disease has diminished your movement. It will, however, help you to maintain balance and posture, and reduce fatigue.  Consciously lift your feet off of the ground when walking. Shuffling and dragging of the feet is a common culprit in losing your balance.  When trying to navigate turns, use a "U" technique of facing forward and making a wide turn, rather than pivoting sharply.  Try to stand with your feet shoulder-length apart. When your feet are close together for any length of time, you increase your risk of losing your balance and falling.  Do one thing at a time. Don't try to walk and accomplish another task, such as reading or looking around. The decrease in your automatic reflexes complicates motor function, so the less distraction, the better.  Do not wear rubber or gripping soled shoes, they might "catch" on the floor and cause tripping.  Move slowly when changing positions. Use deliberate, concentrated movements and, if needed, use a grab bar or walking aid. Count 15 seconds between each movement. For example, when rising from a seated position, wait 15 seconds after standing to begin walking.  If balance is a continuous problem, you might want to consider a walking aid such as a cane, walking stick, or walker. Once you've mastered walking with help, you might be ready to try it on your own again.

## 2024-05-19 ENCOUNTER — Encounter: Payer: Self-pay | Admitting: Neurology

## 2024-06-02 ENCOUNTER — Telehealth: Payer: Self-pay | Admitting: Neurology

## 2024-06-02 NOTE — Telephone Encounter (Signed)
 Received paperwork to be filled out. Form is in Dr. Syble Europe box

## 2024-06-08 DIAGNOSIS — G4733 Obstructive sleep apnea (adult) (pediatric): Secondary | ICD-10-CM | POA: Diagnosis not present

## 2024-06-11 ENCOUNTER — Encounter: Payer: Self-pay | Admitting: Neurology

## 2024-06-11 ENCOUNTER — Telehealth: Payer: Self-pay | Admitting: Neurology

## 2024-06-11 NOTE — Telephone Encounter (Signed)
 Pt called in wanting to ask Dr. Leigh about her long term disability. They dropped her long term disability. They are saying she doesn't have validity in her condition.

## 2024-06-11 NOTE — Telephone Encounter (Signed)
 Pt sent in [facility] chart note and it was sent to doctor Hill.

## 2024-06-16 ENCOUNTER — Telehealth: Payer: Self-pay | Admitting: Neurology

## 2024-06-16 ENCOUNTER — Other Ambulatory Visit: Payer: Self-pay

## 2024-06-16 ENCOUNTER — Other Ambulatory Visit (HOSPITAL_COMMUNITY): Payer: Self-pay

## 2024-06-16 NOTE — Telephone Encounter (Signed)
 Called patient this morning @ 10:14 am. Spoke with patient regarding her most recent MyChart messages to Dr. Leigh concerning her disability claim with [employer]. I informed patient that she will need to address her concerns/issues regarding her disability claim with [employer]. I also informed patient that she has a follow up with Dr. Leigh in December. I shared with the patient that Dr. Leigh is still willing to treat her as a patient if she wishes continue her neurological care with him. Lastly, I shared that if she felt it necessary she could seek a second opinion. Patient verbalized understanding and said ok, thank you. Call was ended.

## 2024-06-25 DIAGNOSIS — G43909 Migraine, unspecified, not intractable, without status migrainosus: Secondary | ICD-10-CM | POA: Diagnosis not present

## 2024-06-25 DIAGNOSIS — R5383 Other fatigue: Secondary | ICD-10-CM | POA: Diagnosis not present

## 2024-06-25 DIAGNOSIS — R42 Dizziness and giddiness: Secondary | ICD-10-CM | POA: Diagnosis not present

## 2024-07-08 DIAGNOSIS — G4733 Obstructive sleep apnea (adult) (pediatric): Secondary | ICD-10-CM | POA: Diagnosis not present

## 2024-07-09 ENCOUNTER — Other Ambulatory Visit (HOSPITAL_COMMUNITY): Payer: Self-pay

## 2024-07-09 MED ORDER — SPIRONOLACTONE 50 MG PO TABS
50.0000 mg | ORAL_TABLET | Freq: Every day | ORAL | 1 refills | Status: DC
  Filled 2024-07-09: qty 30, 30d supply, fill #0
  Filled 2024-08-06 (×2): qty 30, 30d supply, fill #1
  Filled 2024-09-12: qty 30, 30d supply, fill #2
  Filled 2024-10-12: qty 30, 30d supply, fill #3
  Filled 2024-11-11: qty 30, 30d supply, fill #4
  Filled 2024-12-22: qty 30, 30d supply, fill #5

## 2024-08-06 ENCOUNTER — Other Ambulatory Visit (HOSPITAL_COMMUNITY): Payer: Self-pay

## 2024-08-07 ENCOUNTER — Other Ambulatory Visit (HOSPITAL_COMMUNITY): Payer: Self-pay

## 2024-09-01 ENCOUNTER — Other Ambulatory Visit: Payer: Self-pay

## 2024-09-01 ENCOUNTER — Other Ambulatory Visit: Payer: Self-pay | Admitting: Neurology

## 2024-09-01 DIAGNOSIS — G43709 Chronic migraine without aura, not intractable, without status migrainosus: Secondary | ICD-10-CM

## 2024-09-01 DIAGNOSIS — Z01419 Encounter for gynecological examination (general) (routine) without abnormal findings: Secondary | ICD-10-CM | POA: Diagnosis not present

## 2024-09-04 ENCOUNTER — Other Ambulatory Visit (HOSPITAL_COMMUNITY): Payer: Self-pay

## 2024-09-04 MED ORDER — NORTRIPTYLINE HCL 50 MG PO CAPS
50.0000 mg | ORAL_CAPSULE | Freq: Every day | ORAL | 4 refills | Status: AC
  Filled 2024-09-04: qty 30, 30d supply, fill #0

## 2024-09-12 ENCOUNTER — Other Ambulatory Visit: Payer: Self-pay

## 2024-09-17 ENCOUNTER — Other Ambulatory Visit: Payer: Self-pay

## 2024-09-17 ENCOUNTER — Other Ambulatory Visit (HOSPITAL_COMMUNITY): Payer: Self-pay

## 2024-09-17 DIAGNOSIS — G43809 Other migraine, not intractable, without status migrainosus: Secondary | ICD-10-CM | POA: Diagnosis not present

## 2024-09-17 DIAGNOSIS — Z1331 Encounter for screening for depression: Secondary | ICD-10-CM | POA: Diagnosis not present

## 2024-09-17 DIAGNOSIS — G4733 Obstructive sleep apnea (adult) (pediatric): Secondary | ICD-10-CM | POA: Diagnosis not present

## 2024-09-17 DIAGNOSIS — G43019 Migraine without aura, intractable, without status migrainosus: Secondary | ICD-10-CM | POA: Diagnosis not present

## 2024-09-17 DIAGNOSIS — G4482 Headache associated with sexual activity: Secondary | ICD-10-CM | POA: Diagnosis not present

## 2024-09-17 MED ORDER — SCOPOLAMINE 1 MG/3DAYS TD PT72
MEDICATED_PATCH | TRANSDERMAL | 1 refills | Status: DC
  Filled 2024-09-17: qty 10, 30d supply, fill #0
  Filled 2024-10-12: qty 10, 30d supply, fill #1

## 2024-09-17 MED ORDER — VENLAFAXINE HCL ER 37.5 MG PO CP24
ORAL_CAPSULE | ORAL | 2 refills | Status: DC
  Filled 2024-09-17: qty 30, 30d supply, fill #0
  Filled 2024-10-12: qty 30, 30d supply, fill #1
  Filled 2024-11-11: qty 30, 30d supply, fill #2

## 2024-10-03 DIAGNOSIS — G4482 Headache associated with sexual activity: Secondary | ICD-10-CM | POA: Diagnosis not present

## 2024-10-09 ENCOUNTER — Other Ambulatory Visit (HOSPITAL_COMMUNITY): Payer: Self-pay

## 2024-10-17 ENCOUNTER — Other Ambulatory Visit (HOSPITAL_COMMUNITY): Payer: Self-pay

## 2024-10-17 MED ORDER — NURTEC 75 MG PO TBDP
75.0000 mg | ORAL_TABLET | Freq: Every day | ORAL | 11 refills | Status: AC | PRN
  Filled 2024-10-17: qty 8, 8d supply, fill #0
  Filled 2024-11-11: qty 8, 8d supply, fill #1
  Filled 2024-12-22 – 2024-12-24 (×2): qty 8, 30d supply, fill #2
  Filled 2025-01-23: qty 8, 30d supply, fill #3

## 2024-11-11 ENCOUNTER — Other Ambulatory Visit (HOSPITAL_COMMUNITY): Payer: Self-pay

## 2024-11-12 ENCOUNTER — Other Ambulatory Visit (HOSPITAL_COMMUNITY): Payer: Self-pay

## 2024-11-12 MED ORDER — SCOPOLAMINE 1 MG/3DAYS TD PT72
1.0000 | MEDICATED_PATCH | TRANSDERMAL | 1 refills | Status: DC
  Filled 2024-11-12: qty 10, 30d supply, fill #0
  Filled 2024-12-22: qty 10, 30d supply, fill #1

## 2024-11-17 ENCOUNTER — Other Ambulatory Visit (HOSPITAL_COMMUNITY): Payer: Self-pay

## 2024-11-17 DIAGNOSIS — G43019 Migraine without aura, intractable, without status migrainosus: Secondary | ICD-10-CM | POA: Diagnosis not present

## 2024-11-17 DIAGNOSIS — G43809 Other migraine, not intractable, without status migrainosus: Secondary | ICD-10-CM | POA: Diagnosis not present

## 2024-11-17 MED ORDER — VENLAFAXINE HCL ER 37.5 MG PO CP24
75.0000 mg | ORAL_CAPSULE | Freq: Every day | ORAL | 0 refills | Status: DC
  Filled 2024-11-17 – 2024-12-22 (×3): qty 60, 30d supply, fill #0

## 2024-11-21 ENCOUNTER — Ambulatory Visit: Payer: Self-pay | Admitting: Neurology

## 2024-11-22 ENCOUNTER — Other Ambulatory Visit (HOSPITAL_COMMUNITY): Payer: Self-pay

## 2024-11-27 ENCOUNTER — Other Ambulatory Visit (HOSPITAL_COMMUNITY): Payer: Self-pay

## 2024-11-27 MED ORDER — METOCLOPRAMIDE HCL 5 MG PO TABS
5.0000 mg | ORAL_TABLET | Freq: Two times a day (BID) | ORAL | 2 refills | Status: AC | PRN
  Filled 2024-11-27: qty 30, 8d supply, fill #0
  Filled 2024-12-22 – 2024-12-24 (×2): qty 30, 8d supply, fill #1

## 2024-12-22 ENCOUNTER — Other Ambulatory Visit (HOSPITAL_COMMUNITY): Payer: Self-pay

## 2024-12-24 ENCOUNTER — Other Ambulatory Visit (HOSPITAL_COMMUNITY): Payer: Self-pay

## 2025-01-23 ENCOUNTER — Other Ambulatory Visit (HOSPITAL_COMMUNITY): Payer: Self-pay

## 2025-01-23 ENCOUNTER — Other Ambulatory Visit: Payer: Self-pay

## 2025-01-23 MED ORDER — VENLAFAXINE HCL ER 37.5 MG PO CP24
75.0000 mg | ORAL_CAPSULE | Freq: Every day | ORAL | 0 refills | Status: AC
  Filled 2025-01-23: qty 60, 30d supply, fill #0

## 2025-01-23 MED ORDER — SCOPOLAMINE 1 MG/3DAYS TD PT72
1.0000 | MEDICATED_PATCH | TRANSDERMAL | 1 refills | Status: AC
  Filled 2025-01-23: qty 10, 30d supply, fill #0

## 2025-01-23 MED ORDER — SPIRONOLACTONE 50 MG PO TABS
50.0000 mg | ORAL_TABLET | Freq: Every day | ORAL | 1 refills | Status: AC
  Filled 2025-01-23: qty 90, 90d supply, fill #0
